# Patient Record
Sex: Male | Born: 1970 | Race: White | Hispanic: No | Marital: Married | Smoking: Never smoker
Health system: Southern US, Community
[De-identification: ages and names within clinical notes are randomized; demographics above are authoritative.]

## PROBLEM LIST (undated history)

## (undated) DIAGNOSIS — I10 Essential (primary) hypertension: Secondary | ICD-10-CM

## (undated) DIAGNOSIS — M7650 Patellar tendinitis, unspecified knee: Secondary | ICD-10-CM

## (undated) DIAGNOSIS — K219 Gastro-esophageal reflux disease without esophagitis: Secondary | ICD-10-CM

## (undated) DIAGNOSIS — J45909 Unspecified asthma, uncomplicated: Secondary | ICD-10-CM

## (undated) DIAGNOSIS — M533 Sacrococcygeal disorders, not elsewhere classified: Secondary | ICD-10-CM

## (undated) DIAGNOSIS — Z9889 Other specified postprocedural states: Secondary | ICD-10-CM

## (undated) DIAGNOSIS — M2020 Hallux rigidus, unspecified foot: Secondary | ICD-10-CM

## (undated) DIAGNOSIS — M7662 Achilles tendinitis, left leg: Secondary | ICD-10-CM

## (undated) HISTORY — DX: Hallux rigidus, unspecified foot: M20.20

## (undated) HISTORY — DX: Sacrococcygeal disorders, not elsewhere classified: M53.3

## (undated) HISTORY — DX: Achilles tendinitis, left leg: M76.62

## (undated) HISTORY — DX: Patellar tendinitis, unspecified knee: M76.50

## (undated) HISTORY — DX: Unspecified asthma, uncomplicated: J45.909

## (undated) HISTORY — PX: KNEE SURGERY: SHX244

## (undated) HISTORY — PX: NO PAST SURGERIES: SHX2092

## (undated) HISTORY — DX: Gastro-esophageal reflux disease without esophagitis: K21.9

## (undated) HISTORY — DX: Essential (primary) hypertension: I10

## (undated) HISTORY — DX: Other specified postprocedural states: Z98.890

---

## 1998-11-04 ENCOUNTER — Encounter: Admission: RE | Admit: 1998-11-04 | Discharge: 1998-11-04 | Payer: Self-pay | Admitting: Sports Medicine

## 1998-12-30 ENCOUNTER — Encounter: Admission: RE | Admit: 1998-12-30 | Discharge: 1998-12-30 | Payer: Self-pay | Admitting: Family Medicine

## 1999-03-03 ENCOUNTER — Encounter: Admission: RE | Admit: 1999-03-03 | Discharge: 1999-03-03 | Payer: Self-pay | Admitting: Sports Medicine

## 2000-01-12 ENCOUNTER — Encounter: Admission: RE | Admit: 2000-01-12 | Discharge: 2000-01-12 | Payer: Self-pay | Admitting: Sports Medicine

## 2000-10-25 ENCOUNTER — Encounter: Admission: RE | Admit: 2000-10-25 | Discharge: 2000-10-25 | Payer: Self-pay | Admitting: Sports Medicine

## 2001-05-20 ENCOUNTER — Encounter: Admission: RE | Admit: 2001-05-20 | Discharge: 2001-05-20 | Payer: Self-pay | Admitting: Sports Medicine

## 2001-12-19 ENCOUNTER — Encounter: Admission: RE | Admit: 2001-12-19 | Discharge: 2001-12-19 | Payer: Self-pay | Admitting: Sports Medicine

## 2002-01-02 ENCOUNTER — Encounter: Admission: RE | Admit: 2002-01-02 | Discharge: 2002-01-02 | Payer: Self-pay | Admitting: Sports Medicine

## 2002-10-02 ENCOUNTER — Encounter: Admission: RE | Admit: 2002-10-02 | Discharge: 2002-10-02 | Payer: Self-pay | Admitting: Sports Medicine

## 2004-01-21 ENCOUNTER — Encounter: Admission: RE | Admit: 2004-01-21 | Discharge: 2004-01-21 | Payer: Self-pay | Admitting: Sports Medicine

## 2005-01-05 ENCOUNTER — Ambulatory Visit: Payer: Self-pay | Admitting: Sports Medicine

## 2005-02-01 ENCOUNTER — Ambulatory Visit: Payer: Self-pay | Admitting: Sports Medicine

## 2005-09-03 ENCOUNTER — Ambulatory Visit: Payer: Self-pay | Admitting: Family Medicine

## 2005-12-18 ENCOUNTER — Ambulatory Visit: Payer: Self-pay | Admitting: Family Medicine

## 2006-08-16 ENCOUNTER — Encounter: Payer: Self-pay | Admitting: *Deleted

## 2006-10-04 ENCOUNTER — Ambulatory Visit: Payer: Self-pay | Admitting: Sports Medicine

## 2006-10-04 DIAGNOSIS — M79609 Pain in unspecified limb: Secondary | ICD-10-CM | POA: Insufficient documentation

## 2006-10-04 DIAGNOSIS — M25559 Pain in unspecified hip: Secondary | ICD-10-CM | POA: Insufficient documentation

## 2006-10-04 DIAGNOSIS — M533 Sacrococcygeal disorders, not elsewhere classified: Secondary | ICD-10-CM | POA: Insufficient documentation

## 2006-11-12 ENCOUNTER — Ambulatory Visit: Payer: Self-pay | Admitting: Sports Medicine

## 2006-11-12 DIAGNOSIS — M7650 Patellar tendinitis, unspecified knee: Secondary | ICD-10-CM | POA: Insufficient documentation

## 2009-01-31 ENCOUNTER — Ambulatory Visit: Payer: Self-pay | Admitting: Sports Medicine

## 2009-01-31 DIAGNOSIS — M2020 Hallux rigidus, unspecified foot: Secondary | ICD-10-CM | POA: Insufficient documentation

## 2009-01-31 DIAGNOSIS — Q667 Congenital pes cavus, unspecified foot: Secondary | ICD-10-CM | POA: Insufficient documentation

## 2009-08-09 ENCOUNTER — Ambulatory Visit: Payer: Self-pay | Admitting: Internal Medicine

## 2009-08-09 DIAGNOSIS — J45909 Unspecified asthma, uncomplicated: Secondary | ICD-10-CM

## 2009-08-10 ENCOUNTER — Ambulatory Visit: Payer: Self-pay | Admitting: Internal Medicine

## 2009-08-10 LAB — CONVERTED CEMR LAB
AST: 35 units/L (ref 0–37)
BUN: 18 mg/dL (ref 6–23)
Basophils Absolute: 0 10*3/uL (ref 0.0–0.1)
Bilirubin, Direct: 0.2 mg/dL (ref 0.0–0.3)
Calcium: 9.3 mg/dL (ref 8.4–10.5)
Cholesterol: 178 mg/dL (ref 0–200)
Creatinine, Ser: 0.8 mg/dL (ref 0.4–1.5)
Eosinophils Relative: 1.2 % (ref 0.0–5.0)
GFR calc non Af Amer: 113.21 mL/min (ref >60)
Glucose, Bld: 92 mg/dL (ref 70–99)
HCT: 46.1 % (ref 39.0–52.0)
HDL: 78.6 mg/dL (ref >39.00)
LDL Cholesterol: 94 mg/dL (ref 0–99)
Leukocytes, UA: NEGATIVE
Lymphs Abs: 1.7 10*3/uL (ref 0.7–4.0)
Monocytes Absolute: 0.4 10*3/uL (ref 0.1–1.0)
Monocytes Relative: 6.2 % (ref 3.0–12.0)
Neutrophils Relative %: 63.6 % (ref 43.0–77.0)
PSA: 0.66 ng/mL (ref 0.10–4.00)
Platelets: 138 10*3/uL — ABNORMAL LOW (ref 150.0–400.0)
Potassium: 4.1 meq/L (ref 3.5–5.1)
RDW: 12.5 % (ref 11.5–14.6)
Specific Gravity, Urine: <=1.005 (ref 1.000–1.030)
TSH: 1.31 microintl units/mL (ref 0.35–5.50)
Total Bilirubin: 1 mg/dL (ref 0.3–1.2)
Triglycerides: 29 mg/dL (ref 0.0–149.0)
Urobilinogen, UA: 0.2 (ref 0.0–1.0)
VLDL: 5.8 mg/dL (ref 0.0–40.0)
WBC: 6 10*3/uL (ref 4.5–10.5)
pH: 6 (ref 5.0–8.0)

## 2010-01-09 ENCOUNTER — Ambulatory Visit: Payer: Self-pay | Admitting: Internal Medicine

## 2010-01-09 DIAGNOSIS — J029 Acute pharyngitis, unspecified: Secondary | ICD-10-CM

## 2010-03-14 ENCOUNTER — Ambulatory Visit: Payer: Self-pay | Admitting: Sports Medicine

## 2010-06-06 ENCOUNTER — Ambulatory Visit
Admission: RE | Admit: 2010-06-06 | Discharge: 2010-06-06 | Payer: Self-pay | Source: Home / Self Care | Attending: Internal Medicine | Admitting: Internal Medicine

## 2010-06-06 DIAGNOSIS — R05 Cough: Secondary | ICD-10-CM

## 2010-06-06 DIAGNOSIS — R059 Cough, unspecified: Secondary | ICD-10-CM | POA: Insufficient documentation

## 2010-06-06 DIAGNOSIS — K219 Gastro-esophageal reflux disease without esophagitis: Secondary | ICD-10-CM | POA: Insufficient documentation

## 2010-06-16 ENCOUNTER — Telehealth: Payer: Self-pay | Admitting: Internal Medicine

## 2010-07-11 NOTE — Assessment & Plan Note (Signed)
Summary: sore throat-lb   Vital Signs:  Patient profile:   40 year old male Height:      68 inches (172.72 cm) Weight:      161 pounds (73.18 kg) BMI:     24.57 O2 Sat:      97 % on Room air Temp:     98.3 degrees F (36.83 degrees C) oral Pulse rate:   46 / minute BP sitting:   124 / 80  (left arm) Cuff size:   regular  Vitals Entered By: Orlan Leavens RMA (January 09, 2010 11:23 AM)  O2 Flow:  Room air CC: Sore throat, URI symptoms Is Patient Diabetic? No Pain Assessment Patient in pain? no      Comments Pt states been having sore throat off and on for about 2 months   Primary Care Provider:  Newt Lukes MD  CC:  Sore throat and URI symptoms.  History of Present Illness:  URI Symptoms      This is a 40 year old man who presents with URI symptoms.  The symptoms began 2 months ago.  The severity is described as moderate.  wax and wane symptoms of pain in throat, esp with singining and esp 1st AM waking -.  The patient reports sore throat and sick contacts, but denies nasal congestion, purulent nasal discharge, dry cough, productive cough, and earache.  The patient denies fever, stiff neck, dyspnea, wheezing, rash, vomiting, diarrhea, and use of an antipyretic.  The patient also reports itchy throat.  The patient denies sneezing, seasonal symptoms, response to antihistamine, headache, muscle aches, and severe fatigue.  Risk factors for Strep sinusitis include Strep exposure.  The patient denies the following risk factors for Strep sinusitis: unilateral facial pain, unilateral nasal discharge, double sickening, tooth pain, and tender adenopathy.    Current Medications (verified): 1)  None  Allergies (verified): No Known Drug Allergies  Past History:  Past Medical History: Asthma  Review of Systems       The patient complains of hoarseness and severe indigestion/heartburn.  The patient denies anorexia, decreased hearing, hemoptysis, and abdominal pain.    Physical  Exam  General:  alert, well-developed, well-nourished, and cooperative to examination.   nontoxic Eyes:  vision grossly intact; pupils equal, round and reactive to light.  conjunctiva and lids normal.    Ears:  normal pinnae bilaterally, without erythema, swelling, or tenderness to palpation. TMs clear with tiny clear effusion/air bubbles, no erythema or cerumen impaction. Hearing grossly normal bilaterally  Mouth:  teeth and gums in good repair; mucous membranes moist, without lesions or ulcers. oropharynx clear without exudate, mod erythema. +PND Lungs:  normal respiratory effort, no intercostal retractions or use of accessory muscles; normal breath sounds bilaterally - no crackles and no wheezes.    Heart:  normal rate, regular rhythm, no murmur, and no rub. BLE without edema.    Impression & Recommendations:  Problem # 1:  ACUTE PHARYNGITIS (ICD-462)  tx for poss infx (wife with strep) as well as antihist for allg rhinitis and PPI for GERD symptoms  instructed on voice rest and to avoid running in extreme heat of day nonsmoker - no weight loss and voice loss in transient each AM upon waking - if persisting symptoms - pt will let us know for further eval and tx as needed  His updated medication list for this problem includes:    Azithromycin 250 Mg Tabs (Azithromycin) .Marland Kitchen... 2 tabs by mouth today, then 1 by mouth  daily starting tomorrow  Instructed to complete antibiotics and call if not improved in 48 hours.   Orders: Prescription Created Electronically [phone]  Complete Medication List: 1)  Azithromycin 250 Mg Tabs (Azithromycin) .... 2 tabs by mouth today, then 1 by mouth daily starting tomorrow 2)  Claritin 10 Mg Tabs (Loratadine) .Marland Kitchen.. 1 by mouth once daily x 7 days,then as needed for allergy symptoms 3)  Omeprazole 20 Mg Cpdr (Omeprazole) .Marland Kitchen.. 1 by mouth once daily x 7 days, then as needed for reflux  Patient Instructions: 1)  it was good to see you today. 2)  treat throat as  discussed - Zpack, claritin and reflux medication (like ompeprazole) - antibiotic prescription has been electronically submitted to your pharmacy. Please take as directed. Contact our office if you believe you're having problems with the medication(s).  3)  Get plenty of rest, drink lots of clear liquids, and use Tylenol or Ibuprofen for fever and comfort. Return in 7-10 days if you're not better:sooner if you're feeling worse. Prescriptions: AZITHROMYCIN 250 MG TABS (AZITHROMYCIN) 2 tabs by mouth today, then 1 by mouth daily starting tomorrow  #6 x 0   Entered and Authorized by:   Newt Lukes MD   Signed by:   Newt Lukes MD on 01/09/2010   Method used:   Electronically to        [facility] [address] [phone]* (retail)       [address]       [address]       [place], [place]  [address]       Ph: [phone]       Fax: [phone]   RxID:   [id number]

## 2010-07-11 NOTE — Assessment & Plan Note (Signed)
Summary: new / bcbs / # / cd   Vital Signs:  Patient profile:   40 year old male Height:      68 inches (172.72 cm) Weight:      161.4 pounds (73.36 kg) O2 Sat:      97 % on Room air Temp:     97.4 degrees F (36.33 degrees C) oral Pulse rate:   55 / minute BP sitting:   110 / 80  (left arm)  Vitals Entered By: Orlan Leavens (August 09, 2009 3:17 PM)  O2 Flow:  Room air CC: New patient Is Patient Diabetic? No Pain Assessment Patient in pain? no        Primary Care Provider:  Newt Lukes MD  CC:  New patient.  History of Present Illness: new pt to me and our practice - here to est care - patient is here today for annual physical. Patient feels well and has no complaints.  not fasting but will return in AM for labs  Preventive Screening-Counseling & Management  Alcohol-Tobacco     Alcohol drinks/day: <1     Alcohol Counseling: not indicated; use of alcohol is not excessive or problematic     Smoking Status: never     Tobacco Counseling: not indicated; no tobacco use  Caffeine-Diet-Exercise     Does Patient Exercise: yes     Exercise Counseling: not indicated; exercise is adequate     Depression Counseling: not indicated; screening negative for depression  Safety-Violence-Falls     Seat Belt Use: yes  Current Medications (verified): 1)  None  Allergies (verified): No Known Drug Allergies  Past History:  Past Medical History: Asthma  Past Surgical History: Denies surgical history  Family History: Family History of Alcoholism/Addiction (parent,grandparent) Family History Hypertension (parent)  Social History: Never Smoked social alcohol works Research officer, political party - married, lives with wife and 2 kids -Smoking Status:  never Does Patient Exercise:  yes Risk analyst Use:  yes  Review of Systems       see HPI above. I have reviewed all other systems and they were negative.   Physical Exam  General:  alert, well-developed, well-nourished, and cooperative  to examination.    Head:  Normocephalic and atraumatic without obvious abnormalities. No apparent alopecia or balding. Eyes:  vision grossly intact; pupils equal, round and reactive to light.  conjunctiva and lids normal.    Ears:  normal pinnae bilaterally, without erythema, swelling, or tenderness to palpation. TMs clear, without effusion, or cerumen impaction. Hearing grossly normal bilaterally  Nose:  External nasal examination shows no deformity or inflammation. Nasal mucosa are pink and moist without lesions or exudates. Mouth:  teeth and gums in good repair; mucous membranes moist, without lesions or ulcers. oropharynx clear without exudate, no erythema.  Neck:  supple, full ROM, no masses, no thyromegaly; no thyroid nodules or tenderness. no JVD or carotid bruits.   Lungs:  normal respiratory effort, no intercostal retractions or use of accessory muscles; normal breath sounds bilaterally - no crackles and no wheezes.    Heart:  normal rate, regular rhythm, no murmur, and no rub. BLE without edema.  Abdomen:  soft, non-tender, normal bowel sounds, no distention; no masses and no appreciable hepatomegaly or splenomegaly.   Msk:  No deformity or scoliosis noted of thoracic or lumbar spine.   Neurologic:  alert & oriented X3 and cranial nerves II-XII symetrically intact.  strength normal in all extremities, sensation intact to light touch, and gait normal.  speech fluent without dysarthria or aphasia; follows commands with good comprehension.  Skin:  no rashes, vesicles, ulcers, or erythema. No nodules or irregularity to palpation.  Psych:  Oriented X3, memory intact for recent and remote, normally interactive, good eye contact, not anxious appearing, not depressed appearing, and not agitated.      Impression & Recommendations:  Problem # 1:  PREVENTIVE HEALTH CARE (ICD-V70.0)  Patient has been counseled on age-appropriate routine health concerns for screening and prevention. These are  reviewed and up-to-date. Immunizations are up-to-date or declined. Labs ordered and will be reviewed; ECG reviewed.   Orders: EKG w/ Interpretation (93000)  Other Orders: Tdap => 49yrs IM ([address]109) Admin 1st Vaccine (60454)  Patient Instructions: 1)  it was good to see you today.  2)  return for physical labs in AM when you are fasting (nothing eat/drink for 8 hours) - your results will be posted on the phone tree for review in 48-72 hours from the time of test completion; call [phone] and enter your 9 digit MRN (listed above on this page, just below your name); if any changes need to be made or there are abnormal results, you will be contacted directly.  3)  EKG and exam today look good - 4)  Tdap vaccine updated today 5)  keep active, maintain your healthy weight 6)  Please schedule a follow-up appointment annually for medical physical and labs, sooner if any problems.    Immunization History:  Pneumovax Immunization History:    Pneumovax:  historical (06/12/2007)  Immunizations Administered:  Tetanus Vaccine:    Vaccine Type: Tdap    Site: right deltoid    Mfr: GlaxoSmithKline    Dose: 0.5 ml    Route: IM    Given by: Orlan Leavens    Exp. Date: 08/06/2011    Lot #: [id number]    VIS given: 08/09/09

## 2010-07-11 NOTE — Assessment & Plan Note (Signed)
Summary: FOOT ISSUES,MC   Vital Signs:  Patient profile:   40 year old male Pulse rate:   43 / minute BP sitting:   152 / 89  (left arm)  Vitals Entered By: Rochele Pages RN (March 14, 2010 9:00 AM) CC: rt great toe pain with tennis, rt mid foot pain w/ running   Primary Provider:  Newt Lukes MD  CC:  rt great toe pain with tennis and rt mid foot pain w/ running.  History of Present Illness: Patient retuns to clinic today for evaluation of rt great toe pain and rt mid foot pain.   Patient is a runner, does about 25 miles per week.  He also plays tennis 3 times per week. Rt great toe pain occurs after playing tennis for about 1 month. Rt mid foot pain occurs while running for the last 3-4 months.   Original injury to great toe in college    Allergies: No Known Drug Allergies  Physical Exam  General:  Well-developed,well-nourished,in no acute distress; alert,appropriate and cooperative throughout examination Msk:  moderate loss of arch long bilaterally this corrects w orthotics  RT great toe shows marked spurring and change motion is limited but still has 20 deg flex and 20 of extension LT great toe w good motion  MSK  [place] Great to shows spurs superiorly and laterally on dynamic motion one spurs impinges from phalanx side to his MC side of joint with even 10 deg of dorsiflexion inc fluid noted also inc doppler activity       Impression & Recommendations:  Problem # 1:  HALLUX RIGIDUS, 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 1 by mouth once daily x 7 days,then as needed for allergy symptoms 2)  Omeprazole 20 Mg Cpdr (Omeprazole) .Marland Kitchen.. 1 by mouth  once daily x 7 days, then as needed for reflux 3)  Ketoprofen Gel 20%  .... Use qid Prescriptions: KETOPROFEN GEL 20% use qid  #60 gms x 12   Entered and Authorized by:   Enid Baas MD   Signed by:   Enid Baas MD on 03/14/2010   Method used:   Print then Give to Patient   RxID:   [id number]

## 2010-07-13 NOTE — Assessment & Plan Note (Signed)
Summary: cough for a few days getting worse/cd   Vital Signs:  Patient profile:   40 year old male Height:      68 inches (172.72 cm) Weight:      161 pounds (73.18 kg) O2 Sat:      97 % on Room air Temp:     99.2 degrees F (37.33 degrees C) oral Pulse rate:   60 / minute BP sitting:   138 / 80  (left arm) Cuff size:   regular  Vitals Entered By: Orlan Leavens RMA (June 06, 2010 10:44 AM)  O2 Flow:  Room air CC: Cough Is Patient Diabetic? No Pain Assessment Patient in pain? no      Comments Pt states was given rx for Z-pack & prednisone last week. completed Zpack yesterday, and prednisone today. Still have cough with phlegm. Yesterday pt states cough up bright red phlegm. Coughing alot a night. Also c/o of ongoing heart burn has not been taking omeprazole   Primary Care Provider:  Newt Lukes MD  CC:  Cough.  History of Present Illness: c/o cough -  recent bronchitis symptoms - s/p Zpak and pred pak - improved but not resolved - bright red blood with sputum/cough each AM x 2 AM -  no CP, no fever, no DOE or SOB - denies NS, no travel hx - no LE swelling/edema inc reflux/heartburn symptoms with cough - coughing all night GERD not improved with zantac not using other otc meds  Current Medications (verified): 1)  Omeprazole 20 Mg Cpdr (Omeprazole) .Marland Kitchen.. 1 By Mouth Once Daily X 7 Days, Then As Needed For Reflux 2)  Ketoprofen Gel 20% .... Use Qid 3)  Proventil Hfa 108 (90 Base) Mcg/act Aers (Albuterol Sulfate) .... Use As Needed 4)  Symbicort 80-4.5 Mcg/act Aero (Budesonide-Formoterol Fumarate) .... Use As Needed  Allergies (verified): No Known Drug Allergies  Past History:  Past Medical History: Asthma GERD  Review of Systems  The patient denies anorexia, weight loss, and peripheral edema.    Physical Exam  General:  alert, well-developed, well-nourished, and cooperative to examination.   nontoxic Eyes:  vision grossly intact; pupils equal, round and  reactive to light.  conjunctiva and lids normal.    Ears:  normal pinnae bilaterally, without erythema, swelling, or tenderness to palpation. TMs clear, without effusion, or cerumen impaction. Hearing grossly normal bilaterally  Mouth:  teeth and gums in good repair; mucous membranes moist, without lesions or ulcers. oropharynx clear without exudate, no erythema.  Neck:  supple, full ROM, no masses, no thyromegaly; no thyroid nodules or tenderness. no JVD or carotid bruits.   Lungs:  normal respiratory effort, no intercostal retractions or use of accessory muscles; normal breath sounds bilaterally - no crackles and no wheezes.    Heart:  normal rate, regular rhythm, no murmur, and no rub. BLE without edema.   Impression & Recommendations:  Problem # 1:  COUGH (ICD-786.2) recent bronchitis - infx seems resolved s/p Zpak tx cough symptoms - tussionex faxed to pharm - offered cxr due to hemopytsis - pt declines but will call if persisitng symptoms - no weight loss, nonsmoker - suspect bronchial irritation tx GERD with PPI and cough control advised to use asthma meds regualrly as rx'd during season change and acute illness - refills done  Problem # 2:  ASTHMA (ICD-493.90)  His updated medication list for this problem includes:    Proventil Hfa 108 (90 Base) Mcg/act Aers (Albuterol sulfate) .Marland Kitchen... 2 inhalations every 4  hours as needed for asthma symptoms and shortness of breath    Symbicort 80-4.5 Mcg/act Aero (Budesonide-formoterol fumarate) .Marland Kitchen... 2 puffs two times a day  Pulmonary Functions Reviewed: O2 sat: 97 (06/06/2010)  Orders: Prescription Created Electronically [phone])  Problem # 3:  GERD (ICD-530.81)  His updated medication list for this problem includes:    Omeprazole 20 Mg Tbec (Omeprazole) .Marland Kitchen... 1 by mouth two times a day x 7 days, then once daily for reflux symptoms  Labs Reviewed: Hgb: 15.7 (08/10/2009)   Hct: 46.1 (08/10/2009)  Orders: Prescription Created  Electronically [phone])  Complete Medication List: 1)  Omeprazole 20 Mg Tbec (Omeprazole) .Marland Kitchen.. 1 by mouth two times a day x 7 days, then once daily for reflux symptoms 2)  Ketoprofen Gel 20%  .... Use qid 3)  Proventil Hfa 108 (90 Base) Mcg/act Aers (Albuterol sulfate) .... 2 inhalations every 4 hours as needed for asthma symptoms and shortness of breath 4)  Symbicort 80-4.5 Mcg/act Aero (Budesonide-formoterol fumarate) .... 2 puffs two times a day 5)  Tussionex Pennkinetic Er 10-8 Mg/69ml Lqcr (Hydrocod polst-chlorphen polst) .... 5cc by mouth every 12h for cough symptoms  Patient Instructions: 1)  it was good to see you today. 2)  Tussionex for cough symptoms andomeprazole for reflux/heartburn - your prescriptions have been electronically submitted or faxed to your pharmacy. Please take as directed. Contact our office if you believe you're having problems with the medication(s).  3)  take asthma medications regularly, espically during fall/winter season when symptoms are worse 4)  Get plenty of rest, drink lots of clear liquids, and use Tylenol or Ibuprofen for fever and comfort. Return in 7-10 days if you're not better:sooner if you're feeling worse. 5)  If continued cough or blood in sputum, call for chest xray as we discussed today Prescriptions: TUSSIONEX PENNKINETIC ER 10-8 MG/5ML LQCR (HYDROCOD POLST-CHLORPHEN POLST) 5cc by mouth every 12h for cough symptoms  #100cc x 0   Entered and Authorized by:   Newt Lukes MD   Signed by:   Newt Lukes MD on 06/06/2010   Method used:   Printed then faxed to ...       [facility] [address] [phone]* (retail)       [address]       [address]       [place], [place]  [address]       Ph: [phone]       Fax: [phone]   RxID:   [id number] SYMBICORT 80-4.5 MCG/ACT AERO (BUDESONIDE-FORMOTEROL FUMARATE) 2 puffs two times a day  #1 x 3   Entered and Authorized by:   Newt Lukes MD   Signed by:   Newt Lukes MD  on 06/06/2010   Method used:   Electronically to        [facility] [address] [phone]* (retail)       [address]       [address]       [place], [place]  [address]       Ph: [phone]       Fax: [phone]   RxID:   [id number] PROVENTIL HFA 108 (90 BASE) MCG/ACT AERS (ALBUTEROL SULFATE) 2 inhalations every 4 hours as needed for asthma symptoms and shortness of breath  #1 x 1   Entered and Authorized by:   Newt Lukes MD   Signed by:   Newt Lukes MD on 06/06/2010   Method used:   Electronically to  [facility] [address] [phone]* (retail)       [address]       [address]       [place], [place]  [address]       Ph: [phone]       Fax: [phone]   RxID:   [id number] OMEPRAZOLE 20 MG TBEC (OMEPRAZOLE) 1 by mouth two times a day x 7 days, then once daily for reflux symptoms  #30 x 1   Entered and Authorized by:   Newt Lukes MD   Signed by:   Newt Lukes MD on 06/06/2010   Method used:   Electronically to        [facility] [address] [phone]* (retail)       [address]       [address]       [place], [place]  [address]       Ph: [phone]       Fax: [phone]   RxID:   [id number]    Orders Added: 1)  Est. Patient Level IV [32[address]] 2)  Prescription Created Electronically [phone]

## 2010-07-13 NOTE — Progress Notes (Signed)
Summary: ABX refill req  Phone Note Call from Patient   Caller: Patient [phone] Summary of Call: Pt called stating that he has almost completed med courses given at last OV for cough and although cough did go away in the last 2 days cough has return. Pt does not want cough to get worse s he is almost done with ABX and is therefore requesting refill of ABX, please advise. Initial call taken by: Margaret Pyle, CMA,  June 16, 2010 11:20 AM  Follow-up for Phone Call        Zpak erx done Follow-up by: Newt Lukes MD,  June 16, 2010 11:48 AM  Additional Follow-up for Phone Call Additional follow up Details #1::        Pt informed via VM Additional Follow-up by: Margaret Pyle, CMA,  June 16, 2010 12:42 PM    New/Updated Medications: AZITHROMYCIN 250 MG TABS (AZITHROMYCIN) 2 tabs by mouth today, then 1 by mouth daily starting tomorrow Prescriptions: AZITHROMYCIN 250 MG TABS (AZITHROMYCIN) 2 tabs by mouth today, then 1 by mouth daily starting tomorrow  #6 x 0   Entered and Authorized by:   Newt Lukes MD   Signed by:   Newt Lukes MD on 06/16/2010   Method used:   Electronically to        [facility] [address] [phone]* (retail)       [address]       [address]       [place], [place]  [address]       Ph: [phone]       Fax: [phone]   RxID:   [id number]

## 2010-12-28 ENCOUNTER — Ambulatory Visit (INDEPENDENT_AMBULATORY_CARE_PROVIDER_SITE_OTHER): Payer: BC Managed Care – PPO | Admitting: Sports Medicine

## 2010-12-28 ENCOUNTER — Encounter: Payer: Self-pay | Admitting: Sports Medicine

## 2010-12-28 VITALS — BP 155/92 | HR 46 | Ht 68.0 in | Wt 160.0 lb

## 2010-12-28 DIAGNOSIS — M7660 Achilles tendinitis, unspecified leg: Secondary | ICD-10-CM

## 2010-12-28 DIAGNOSIS — M7650 Patellar tendinitis, unspecified knee: Secondary | ICD-10-CM

## 2010-12-28 DIAGNOSIS — M722 Plantar fascial fibromatosis: Secondary | ICD-10-CM

## 2010-12-28 DIAGNOSIS — M7662 Achilles tendinitis, left leg: Secondary | ICD-10-CM

## 2010-12-28 NOTE — Progress Notes (Signed)
  Subjective:    Patient ID: Cameron White, male    DOB: Oct 11, 1970, 40 y.o.   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 - running pain is primarily knee and sometimes AT going up hill Cycling 1 day 30 miles - no pain Tennis 3 days week - no pain   Review of Systems     Objective:   Physical Exam NAD    Good ankle mobility, normal exam bilat Rt- Hallux rigidus MTP change  Rt great toe - Extension and flexion 15 deg No pain on palpation of tarsal tunnel or PF on rt  Lt great toe-20 deg extension, and 15 deg flexion Lt AT tender to squeeze but no swelling detected No pain on palpation of tarsal tunnel or PF on lt  Rt knee exam:  normal exam except bony hypertrophy at the end of patella    MSK ultrasound Right knee shows an exostosis calcific change and some tendon hypoechoic change at the distal patellar pole and proximal patellar tendon; remainder of tenderness normal Right Achilles tendon and right and left plantar fascia are all normal to scanning Left Achilles tendon reveals that the AP with just proximal to the calcaneus is 0.47. At 4 cm above the calcaneus he has a nodule that measures 0.85 cm. A longitudinal transverse views there or hypoechoic changes within the nodule. There is normal Doppler activity. There is some slight hypoechoic change and probable fluid in the tendon sheath. Assessment & Plan:

## 2010-12-28 NOTE — Assessment & Plan Note (Signed)
We will begin him on standard exercises for Achilles rehabilitation He lives were added to the orthotics for his shoes I think he should wear these for tinnitus as well as running I want him to start using arch support to the arch pain in his regular shoes He is to use a quarter of a nitroglycerin patch over the nodule Avoid hills and speed work  Engineer, maintenance (IT) in about 6 weeks

## 2010-12-28 NOTE — Assessment & Plan Note (Signed)
History of prior patellar tendinitis and with a new flare of symptoms. Now shows an exostosis with calcific change and some partial separation of the tendon is probable. We'll plan to use a nitroglycerin protocol over the proximal right patellar tendon. Begin eccentric quadriceps exercises Repeat scan and evaluation in 6 weeks

## 2011-01-10 ENCOUNTER — Encounter: Payer: Self-pay | Admitting: Internal Medicine

## 2011-01-11 ENCOUNTER — Other Ambulatory Visit (INDEPENDENT_AMBULATORY_CARE_PROVIDER_SITE_OTHER): Payer: BC Managed Care – PPO

## 2011-01-11 ENCOUNTER — Ambulatory Visit (INDEPENDENT_AMBULATORY_CARE_PROVIDER_SITE_OTHER): Payer: BC Managed Care – PPO | Admitting: Internal Medicine

## 2011-01-11 ENCOUNTER — Encounter: Payer: Self-pay | Admitting: Internal Medicine

## 2011-01-11 VITALS — BP 142/90 | HR 44 | Temp 98.1°F | Ht 68.0 in | Wt 161.8 lb

## 2011-01-11 DIAGNOSIS — I10 Essential (primary) hypertension: Secondary | ICD-10-CM

## 2011-01-11 DIAGNOSIS — Z0000 Encounter for general adult medical examination without abnormal findings: Secondary | ICD-10-CM

## 2011-01-11 LAB — CBC WITH DIFFERENTIAL/PLATELET
Basophils Relative: 0.6 % (ref 0.0–3.0)
Eosinophils Relative: 1.3 % (ref 0.0–5.0)
HCT: 45.9 % (ref 39.0–52.0)
Hemoglobin: 15.9 g/dL (ref 13.0–17.0)
Lymphs Abs: 1.9 10*3/uL (ref 0.7–4.0)
MCV: 100.4 fl — ABNORMAL HIGH (ref 78.0–100.0)
Monocytes Absolute: 0.4 10*3/uL (ref 0.1–1.0)
Monocytes Relative: 6.1 % (ref 3.0–12.0)
RBC: 4.57 Mil/uL (ref 4.22–5.81)
WBC: 6.4 10*3/uL (ref 4.5–10.5)

## 2011-01-11 LAB — LIPID PANEL
Cholesterol: 195 mg/dL (ref 0–200)
HDL: 78.8 mg/dL (ref >39.00)
LDL Cholesterol: 111 mg/dL — ABNORMAL HIGH (ref 0–99)
VLDL: 5.4 mg/dL (ref 0.0–40.0)

## 2011-01-11 LAB — URINALYSIS
Specific Gravity, Urine: <=1.005 (ref 1.000–1.030)
Total Protein, Urine: NEGATIVE
Urine Glucose: NEGATIVE
Urobilinogen, UA: 0.2 (ref 0.0–1.0)
pH: 6 (ref 5.0–8.0)

## 2011-01-11 LAB — HEPATIC FUNCTION PANEL
ALT: 29 U/L (ref 0–53)
Albumin: 4.5 g/dL (ref 3.5–5.2)
Bilirubin, Direct: 0.2 mg/dL (ref 0.0–0.3)
Total Protein: 7.1 g/dL (ref 6.0–8.3)

## 2011-01-11 LAB — BASIC METABOLIC PANEL
BUN: 18 mg/dL (ref 6–23)
Chloride: 103 mEq/L (ref 96–112)
GFR: 137.97 mL/min (ref >60.00)
Potassium: 4.4 mEq/L (ref 3.5–5.1)
Sodium: 138 mEq/L (ref 135–145)

## 2011-01-11 MED ORDER — LOSARTAN POTASSIUM 25 MG PO TABS: 25.0000 mg | ORAL_TABLET | Freq: Every day | ORAL | Status: DC

## 2011-01-11 NOTE — Progress Notes (Signed)
Subjective:    Patient ID: Cameron White, male    DOB: 05-05-1971, 40 y.o.   MRN: [id number]  HPI  patient is here today for annual physical. Patient feels well and has no complaints.  ?re: elev BP last few weeks - increased stress at work but FH HTN noted  Past Medical History  Diagnosis Date  . ASTHMA   . GERD   . Achilles tendinitis on left   . Patellar tendinitis   . HALLUX RIGIDUS, ACQUIRED   . SACROILIAC JOINT DYSFUNCTION    Family History  Problem Relation Age of Onset  . Alcohol abuse Other     parent & grandparent  . Hypertension Other   . Hypertension Father   . Heart disease Paternal Grandfather   . Hypertension Paternal Grandfather    History  Substance Use Topics  . Smoking status: Never Smoker   . 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 Alcohol Use: Yes     social   Review of Systems Constitutional: Negative for fever.  Respiratory: Negative for cough and shortness of breath.   Cardiovascular: Negative for chest pain.  Gastrointestinal: Negative for abdominal pain.  Musculoskeletal: Negative for gait problem.  Skin: Negative for rash.  Neurological: Negative for dizziness.  No other specific complaints in a complete review of systems (except as listed in HPI above).     Objective:   Physical Exam BP 142/90  Pulse 44  Temp(Src) 98.1 F (36.7 C) (Oral)  Ht 5\' 8"  (1.727 m)  Wt 161 lb 12.8 oz (73.392 kg)  BMI 24.60 kg/m2  SpO2 98% Wt Readings from Last 3 Encounters:  01/11/11 161 lb 12.8 oz (73.392 kg)  12/28/10 160 lb (72.576 kg)  06/06/10 161 lb (73.029 kg)   BP Readings from Last 3 Encounters:  01/11/11 142/90  12/28/10 155/92  06/06/10 138/80   Constitutional:  oriented to person, place, and time. appears well-developed and well-nourished. No distress.  Neck: Normal range of motion. Neck supple. No JVD present. No thyromegaly present.  Cardiovascular: Normal rate, regular  rhythm and normal heart sounds.  No murmur heard. no BLE edema Pulmonary/Chest: Effort normal and breath sounds normal. No respiratory distress. no wheezes.  Abdominal: Soft. Bowel sounds are normal. Patient exhibits no distension. There is no tenderness.  Musculoskeletal: Normal range of motion. No gross deformities GU: deferred Neurological: he is alert and oriented to person, place, and time. No cranial nerve deficit. Coordination normal.  Skin: Skin is warm and dry.  No erythema or ulceration.  Psychiatric: he has a normal mood and affect. behavior is normal. Judgment and thought content normal.    Lab Results  Component Value Date   WBC 6.0 08/10/2009   HGB 15.7 08/10/2009   HCT 46.1 08/10/2009   PLT 138.0* 08/10/2009   CHOL 178 08/10/2009   TRIG 29.0 08/10/2009   HDL 78.60 08/10/2009   ALT 38 08/10/2009   AST 35 08/10/2009   NA 138 08/10/2009   K 4.1 08/10/2009   CL 104 08/10/2009   CREATININE 0.8 08/10/2009   BUN 18 08/10/2009   CO2 27 08/10/2009   TSH 1.31 08/10/2009   PSA 0.66 08/10/2009    EKG - marked sinus brady @46bpm , unchanged abn since 08/09/09 with ST elev v1-v3     Assessment & Plan:  CPX - v70.0 - Patient has been counseled on age-appropriate routine health concerns for screening and prevention. These are reviewed  and up-to-date. Immunizations are up-to-date or declined. Labs ordered and ECG reviewed.  New dx HTN - reviewed elevated BP at prior OV and recent OP gym readings, FH same - already at ideal BMI, exercises aggressively - suggest sodium reduction but also start low dose ARB - erx done - recheck, titrate 3-4 weeks, risk/benefit reviewed and pt agrees to same - will call if problems

## 2011-01-11 NOTE — Patient Instructions (Signed)
It was good to see you today. Exam and EKG look good Test(s) ordered today. Your results will be called to you after review (48-72hours after test completion). If any changes need to be made, you will be notified at that time. Will start losartan for high blood pressure - Your prescription(s) have been submitted to your pharmacy. Please take as directed and contact our office if you believe you are having problem(s) with the medication(s). Please schedule followup in 3-4 weeks to recheck blood pressure and titrate medications as needed; call sooner if problems.

## 2011-03-08 ENCOUNTER — Ambulatory Visit (INDEPENDENT_AMBULATORY_CARE_PROVIDER_SITE_OTHER): Payer: BC Managed Care – PPO | Admitting: Family Medicine

## 2011-03-08 ENCOUNTER — Encounter: Payer: Self-pay | Admitting: Family Medicine

## 2011-03-08 VITALS — BP 143/93 | HR 54 | Ht 68.0 in | Wt 155.0 lb

## 2011-03-08 DIAGNOSIS — M7580 Other shoulder lesions, unspecified shoulder: Secondary | ICD-10-CM

## 2011-03-08 DIAGNOSIS — M719 Bursopathy, unspecified: Secondary | ICD-10-CM

## 2011-03-08 MED ORDER — NAPROXEN 500 MG PO TABS: ORAL_TABLET | ORAL | Status: DC

## 2011-03-08 NOTE — Progress Notes (Signed)
  Subjective:    Patient ID: Cameron White, male    DOB: [birth year], 40 y.o.   MRN: [id number]  HPI  Cameron White is complaining of right shoulder pain for the last 2 days, after he played tennis on Monday and Tuesday. He never had problems with his shoulder in the past. He states that his pain in 3/10, anterolateral, dull, worse with overhead activities and reaching to his back ,better with rest,  no numbness or tingling. No injuries.   Patient Active Problem List  Diagnoses  . ASTHMA  . HIP PAIN  . SACROILIAC JOINT DYSFUNCTION  . PATELLAR TENDINITIS  . CALF PAIN  . HALLUX RIGIDUS, ACQUIRED  . TALIPES CAVUS  . GERD  . Tendonitis, patellar  . Achilles tendinitis on left   Current Outpatient Prescriptions on File Prior to Visit  Medication Sig Dispense Refill  . albuterol (PROVENTIL HFA) 108 (90 BASE) MCG/ACT inhaler Inhale 2 puffs into the lungs every 4 (four) hours as needed.        . budesonide-formoterol (SYMBICORT) 80-4.5 MCG/ACT inhaler Inhale 2 puffs into the lungs 2 (two) times daily.        Marland Kitchen losartan (COZAAR) 25 MG tablet Take 1 tablet (25 mg total) by mouth daily.  30 tablet  11  . nitroGLYCERIN (NITRODUR - DOSED IN MG/24 HR) 0.2 mg/hr Place onto the skin daily. You will use 1/4 patch daily to patellar tendon RT and to Achilles tendon left Replace daily      . omeprazole (PRILOSEC) 20 MG capsule Take 20 mg by mouth daily.         No Known Allergies     Review of Systems  Constitutional: Negative for chills, diaphoresis and fatigue.  HENT: Negative for neck pain and neck stiffness.   Musculoskeletal: Negative for back pain and joint swelling.  Neurological: Negative for weakness and numbness.       Objective:   Physical Exam  Constitutional: He appears well-developed and well-nourished.       BP 143/93  Pulse 54  Ht 5\' 8"  (1.727 m)  Wt 155 lb (70.308 kg)  BMI 23.57 kg/m2   Neck: Normal range of motion. Neck supple.  Pulmonary/Chest: Effort normal.    Musculoskeletal:       Right shoulder with intact skin. No swelling no hematomas. Full range of motion for flexion extension internal, external rotation abduction and adduction. Hawkins test positive for tear cuff impingement . No A.c. joint tenderness to palpation with positive crossover test . Empty can test negative for rotator cuff tear. Speed test negative for biceps tendinoplasty No tenderness at the bicipital groove. Strength 5 over 5 with internal and external rotation . Sensation is intact .  Neurological: He is alert.  Skin: Skin is warm. No rash noted. No erythema. No pallor.  Psychiatric: He has a normal mood and affect. His behavior is normal.      MSK U/S :  L elbow: No hypoechoic areas in the extensor tendon, no increase in blood flow. No tendon defect.  L shoulder: No hypoechoic ares in the supraspinatus, subscapular, or infraspinatus. No fluid around biceps. Negative U/S        Assessment & Plan:   1. Rotator cuff tendinitis  naproxen (NAPROSYN) 500 MG tablet   RTC  strengthening exercise  Ice/moist heat 20 min bid F/U prn

## 2011-03-20 ENCOUNTER — Encounter: Payer: Self-pay | Admitting: Internal Medicine

## 2011-03-20 ENCOUNTER — Ambulatory Visit (INDEPENDENT_AMBULATORY_CARE_PROVIDER_SITE_OTHER): Payer: BC Managed Care – PPO | Admitting: Internal Medicine

## 2011-03-20 DIAGNOSIS — I10 Essential (primary) hypertension: Secondary | ICD-10-CM | POA: Insufficient documentation

## 2011-03-20 DIAGNOSIS — J019 Acute sinusitis, unspecified: Secondary | ICD-10-CM

## 2011-03-20 MED ORDER — AZITHROMYCIN 250 MG PO TABS: ORAL_TABLET | ORAL | Status: AC

## 2011-03-20 NOTE — Patient Instructions (Signed)
It was good to see you today. blood pressure looks good today but monitor BP Before your workout and call if consistently >130/85 for dose increase Zpack antibiotics for sinus infection symptoms  Your prescription(s) have been submitted to your pharmacy. Please take as directed and contact our office if you believe you are having problem(s) with the medication(s). Continue the symptomatic treatment with Claritin and Afrin nasal spray as needed Please schedule followup in 4-6 months for blood pressure, call sooner if problems.

## 2011-03-20 NOTE — Progress Notes (Signed)
  Subjective:    Patient ID: Cameron White, male    DOB: September 28, 1970, 40 y.o.   MRN: [id number]  HPI   patient is here today for follow up   HTN: Started ARB 01/2011 for same - the patient reports compliance with medication(s) as prescribed. Denies adverse side effects. At gym, SBP still 130-140 (after workout)  Also complains of productive cough and sinus pressure - onset 2 weeks ago - course seemed improving until last 48h - now worse with increase sputum - no wheeze or chest involvement/asthma flare   Past Medical History  Diagnosis Date  . ASTHMA   . GERD   . Achilles tendinitis on left   . Patellar tendinitis   . HALLUX RIGIDUS, ACQUIRED   . SACROILIAC JOINT DYSFUNCTION   . Hypertension    Review of Systems  Respiratory: Negative for shortness of breath.   Cardiovascular: Negative for chest pain or edema.  Neurological: Negative for dizziness or headache       Objective:   Physical Exam  BP 120/82  Pulse 52  Temp(Src) 99.1 F (37.3 C) (Oral)  Ht 5\' 5"  (1.651 m)  Wt 162 lb (73.483 kg)  BMI 26.96 kg/m2  SpO2 97%  Constitutional: He appears well-developed and well-nourished. No distress.  HENT: NCAT with tenderness over maxillary region L>R, nares with thick discharge; OP mod erythema ,wo exudate - B TMs ok without effusion Neck: Normal range of motion. Neck supple. No JVD present. No thyromegaly present.  Cardiovascular: Normal rate, regular rhythm and normal heart sounds.  No murmur heard. no BLE edema Pulmonary/Chest: Effort normal and breath sounds normal. No respiratory distress. no wheezes.  Psychiatric: he has a normal mood and affect. behavior is normal. Judgment and thought content normal.    Lab Results  Component Value Date   WBC 6.4 01/11/2011   HGB 15.9 01/11/2011   HCT 45.9 01/11/2011   PLT 171.0 01/11/2011   CHOL 195 01/11/2011   TRIG 27.0 01/11/2011   HDL 78.80 01/11/2011   ALT 29 01/11/2011   AST 27 01/11/2011   NA 138 01/11/2011   K 4.4 01/11/2011   CL  103 01/11/2011   CREATININE 0.7 01/11/2011   BUN 18 01/11/2011   CO2 26 01/11/2011   TSH 1.43 01/11/2011   PSA 0.73 01/11/2011       Assessment & Plan:  See problem list. Medications and labs reviewed today.  Also Sinusitus, acute - initially URI/allergy symptoms but now increase congestion/conug symptoms and productive sputum/nasal discharge - yellow green - not improved with OTC meds - LGF today - given duration >2 weeks, tx empiric Zpack and continue symptomatic tx as ongoing

## 2011-03-20 NOTE — Assessment & Plan Note (Signed)
New dx 01/2011-   FH same - already at ideal BMI, exercises aggressively -  ongoing sodium reduction but also started low dose ARB -01/2011 The current medical regimen is effective;  continue present plan and medications.  BP Readings from Last 3 Encounters:  03/20/11 120/82  03/08/11 143/93  01/11/11 142/90

## 2011-03-29 ENCOUNTER — Other Ambulatory Visit: Payer: Self-pay

## 2011-03-29 NOTE — Telephone Encounter (Signed)
A user error has taken place: encounter opened in error, closed for administrative reasons.

## 2011-04-16 ENCOUNTER — Telehealth: Payer: Self-pay | Admitting: *Deleted

## 2011-04-16 DIAGNOSIS — I10 Essential (primary) hypertension: Secondary | ICD-10-CM

## 2011-04-16 MED ORDER — LOSARTAN POTASSIUM 50 MG PO TABS: 50.0000 mg | ORAL_TABLET | Freq: Every day | ORAL | Status: DC

## 2011-04-16 NOTE — Telephone Encounter (Signed)
Noted and agree - new prescription for losartan 50 prescribed electronically today. Patient to continue monitoring and call us as discussed if problems

## 2011-04-16 NOTE — Telephone Encounter (Signed)
Pt has monitored BP as instructed at last OV and states consistently 130s over 90s [this morning reading 156/84]. Requesting Rx to [facility] [[email or web address]] for double Losartan dosage [discussed at OV per Pt].

## 2011-04-16 NOTE — Telephone Encounter (Signed)
LMOM to inform patient. 

## 2011-05-18 ENCOUNTER — Telehealth: Payer: Self-pay | Admitting: *Deleted

## 2011-05-18 NOTE — Telephone Encounter (Signed)
Pt call and states family been having flu sxs. Now he has develop terrible cough. Advise pt he will need ov to be evaluated. Pt states he will call back to schedule appt if he can get a babysitter...05/18/11@10 ;31am/LMB

## 2011-05-22 ENCOUNTER — Ambulatory Visit (INDEPENDENT_AMBULATORY_CARE_PROVIDER_SITE_OTHER): Payer: BC Managed Care – PPO | Admitting: Internal Medicine

## 2011-05-22 ENCOUNTER — Encounter: Payer: Self-pay | Admitting: Internal Medicine

## 2011-05-22 VITALS — BP 102/64 | HR 58 | Temp 98.1°F | Ht 68.0 in | Wt 160.4 lb

## 2011-05-22 DIAGNOSIS — J45909 Unspecified asthma, uncomplicated: Secondary | ICD-10-CM

## 2011-05-22 DIAGNOSIS — I10 Essential (primary) hypertension: Secondary | ICD-10-CM

## 2011-05-22 DIAGNOSIS — R05 Cough: Secondary | ICD-10-CM

## 2011-05-22 MED ORDER — BUDESONIDE-FORMOTEROL FUMARATE 80-4.5 MCG/ACT IN AERO: 2.0000 | INHALATION_SPRAY | Freq: Two times a day (BID) | RESPIRATORY_TRACT | Status: DC

## 2011-05-22 MED ORDER — ALBUTEROL SULFATE HFA 108 (90 BASE) MCG/ACT IN AERS: 2.0000 | INHALATION_SPRAY | RESPIRATORY_TRACT | Status: DC | PRN

## 2011-05-22 MED ORDER — AZITHROMYCIN 250 MG PO TABS: ORAL_TABLET | ORAL | Status: AC

## 2011-05-22 NOTE — Patient Instructions (Signed)
Take all new medications as prescribed Continue all other medications as before  

## 2011-05-27 ENCOUNTER — Encounter: Payer: Self-pay | Admitting: Internal Medicine

## 2011-05-27 NOTE — Progress Notes (Signed)
  Subjective:    Patient ID: Cameron White, male    DOB: July 02, 1970, 40 y.o.   MRN: [id number]  HPI  Here with acute onset mild to mod 2-3 days ST, HA, general weakness and malaise, with prod cough greenish sputum, but Pt denies chest pain, increased sob or doe, wheezing, orthopnea, PND, increased LE swelling, palpitations, dizziness or syncope.  Pt denies new neurological symptoms such as new headache, or facial or extremity weakness or numbness   Pt denies polydipsia, polyuria.   Pt denies fever, wt loss, night sweats, loss of appetite, or other constitutional symptoms except for the above Past Medical History  Diagnosis Date  . ASTHMA   . GERD   . Achilles tendinitis on left   . Patellar tendinitis   . HALLUX RIGIDUS, ACQUIRED   . SACROILIAC JOINT DYSFUNCTION   . Hypertension    Past Surgical History  Procedure Date  . No past surgeries     reports that he has never smoked. He has never used smokeless tobacco. He reports that he drinks alcohol. He reports that he does not use illicit drugs. family history includes Alcohol abuse in his other; Heart disease in his paternal grandfather; and Hypertension in his father, other, and paternal grandfather. No Known Allergies Current Outpatient Prescriptions on File Prior to Visit  Medication Sig Dispense Refill  . albuterol (PROVENTIL HFA) 108 (90 BASE) MCG/ACT inhaler Inhale 2 puffs into the lungs every 4 (four) hours as needed.  1 Inhaler  5  . budesonide-formoterol (SYMBICORT) 80-4.5 MCG/ACT inhaler Inhale 2 puffs into the lungs 2 (two) times daily.  1 Inhaler  5  . losartan (COZAAR) 50 MG tablet Take 1 tablet (50 mg total) by mouth daily.  30 tablet  11  . nitroGLYCERIN (NITRODUR - DOSED IN MG/24 HR) 0.2 mg/hr Place onto the skin daily. You will use 1/4 patch daily to patellar tendon RT and to Achilles tendon left Replace daily      . omeprazole (PRILOSEC) 20 MG capsule Take 20 mg by mouth daily.         Review of Systems Review of  Systems  Constitutional: Negative for diaphoresis and unexpected weight change.  HENT: Negative for drooling and tinnitus.   Eyes: Negative for photophobia and visual disturbance.  Respiratory: Negative for choking and stridor.   Gastrointestinal: Negative for vomiting and blood in stool.  Genitourinary: Negative for hematuria and decreased urine volume.     Objective:   Physical Exam BP 102/64  Pulse 58  Temp(Src) 98.1 F (36.7 C) (Oral)  Ht 5\' 8"  (1.727 m)  Wt 160 lb 6 oz (72.746 kg)  BMI 24.39 kg/m2  SpO2 96% Physical Exam  VS noted, mild ill Constitutional: Pt appears well-developed and well-nourished.  HENT: Head: Normocephalic.  Right Ear: External ear normal.  Left Ear: External ear normal. \Bilat tm's mild erythema.  Sinus nontender.  Pharynx mild erythema Eyes: Conjunctivae and EOM are normal. Pupils are equal, round, and reactive to light.  Neck: Normal range of motion. Neck supple.  Cardiovascular: Normal rate and regular rhythm.   Pulmonary/Chest: Effort normal and breath sounds normal.  Neurological: Pt is alert. No cranial nerve deficit.  Skin: Skin is warm. No erythema.  Psychiatric: Pt behavior is normal. Thought content normal.     Assessment & Plan:

## 2011-05-27 NOTE — Assessment & Plan Note (Signed)
stable overall by hx and exam, most recent data reviewed with pt, and pt to continue medical treatment as before SpO2 Readings from Last 3 Encounters:  05/22/11 96%  03/20/11 97%  01/11/11 98%

## 2011-05-27 NOTE — Assessment & Plan Note (Signed)
stable overall by hx and exam, most recent data reviewed with pt, and pt to continue medical treatment as before BP Readings from Last 3 Encounters:  05/22/11 102/64  03/20/11 120/82  03/08/11 143/93

## 2011-05-27 NOTE — Assessment & Plan Note (Signed)
C/w acute bronchitis, Mild to mod, for antibx course,  to f/u any worsening symptoms or concerns

## 2012-04-14 ENCOUNTER — Ambulatory Visit (INDEPENDENT_AMBULATORY_CARE_PROVIDER_SITE_OTHER): Payer: BC Managed Care – PPO | Admitting: Sports Medicine

## 2012-04-14 ENCOUNTER — Encounter: Payer: Self-pay | Admitting: Sports Medicine

## 2012-04-14 VITALS — BP 130/78 | HR 55 | Ht 68.0 in | Wt 160.0 lb

## 2012-04-14 DIAGNOSIS — S76319A Strain of muscle, fascia and tendon of the posterior muscle group at thigh level, unspecified thigh, initial encounter: Secondary | ICD-10-CM

## 2012-04-14 DIAGNOSIS — IMO0002 Reserved for concepts with insufficient information to code with codable children: Secondary | ICD-10-CM

## 2012-04-14 NOTE — Progress Notes (Signed)
Patient ID: Cameron White, male   DOB: 13-Aug-1970, 41 y.o.   MRN: [id number]  SUBJECTIVE: Cameron White is a 41 y.o. male with PMHx of HTN presenting for acute RIGHT buttock / posterior thigh pain.  Yesterday, while playing tennis, he was sprinting for an outside ball and over-extended himself.  While planting and snapping around to return he heard and felt a pop in his posterior buttock/thigh.  He reports immediate pain but since he was near the end of the match he was able to finish but limited by pain.  Denies any swelling, ecchymosis or gross deformity at any point.  After initial event denies any additional popping.   He has been using alleve and ice as often as possible since original injury with limited response.  Pt can ambulate pain free but notes some discomfort with stairs and any exertion beyond a brisk-walk.  No major orthopedic history, prior achilles strain, patellar tendon strain(s) treated with nitroglycerin topically and conservative management.  He wears orthotics made by Dr. Darrick Penna @ [facility].  PMHx: HTN PSHx: Negative  Meds: Lorsartan  Allergies: None  Social: Non-smoker, occasional drinker No supplement use, OTC medications or illicit drugs No recent antibiotic use or recent illness Plays tennis approx 3 days per week with friends Regular runner, most runs < 5 miles   OBJECTIVE: Vital signs as noted above. Appearance: Alert, oriented well-appearing male.  Resting comfortably in room.  Hip/Knee exam:  - No gross abnormalities on inspection of bilateral knees and posterior thigh - AROM limited in hip flexion on RIGHT side secondary to pain/tightness to approx 60 degrees, LEFT approx 80 degrees.  Symmetric and appropriate in hip extension, IR, ER.   - Knee flexion/extension symmetric as well - PROM patient can achieve 90 degrees of hip flexion bilaterally before feeling uncomfortable - 5/5 strength in knee flexion/extension.  Some pain with resisted knee flexion  in supine position. - 5/5 strength in hip abduction, adduction bilaterally and hip flexion - On prone exam there is minimal tenderness to palpation of ischial tuberosity on RIGHT.  Pt with pain with resisted prone hip extension > resisted knee flexion.  Both cause some pain but strength 5/5. - Lachman's, Valgus/Varus stress testing negative on RIGHT knee - With patient standing and both knees extended there are no appreciable abnormalities in the contour or shape of posterior thigh's musculature  Posterior Thigh U/S (RIGHT): - Common hamstring origin was visualized at RIGHT ischial tuberosity.  No obvious abnormalities to include calcification, fluid collection or hypoechoic changes - No neovascularization seen on doppler of common origin or any portion of tendon. - Tendon's intact at common origin and all three hamstring muscle's followed from common origin to level of popliteal fossa without defect, edema, calcifications or hypoechoic changes.     ASSESSMENT: 1. RIGHT proximal hamstring strain at common origin, acute  PLAN: Pt with history and exam above suggestive of proximal hamstring tendon strain on RIGHT. No appreciable defects seen on exam or sonography.  Pt will be treated conservatively and was offered a body helix compression sleeve but he declined.  Also instructed and demonstrated eccentric hamstring exercises to be completed 3 sets of 15 with low weight initially and then slowly increase with time.  Pt instructed to avoid activities with sharp, explosive movements/cutting like tennis for 2-3 weeks but ok to return to running as symptoms allow. He will return in 3 -4 weeks as scheduled for orthotics.  -- Kenney Houseman, MS IV

## 2012-04-30 ENCOUNTER — Other Ambulatory Visit: Payer: Self-pay | Admitting: Internal Medicine

## 2012-05-13 ENCOUNTER — Ambulatory Visit (INDEPENDENT_AMBULATORY_CARE_PROVIDER_SITE_OTHER): Payer: BC Managed Care – PPO | Admitting: Sports Medicine

## 2012-05-13 DIAGNOSIS — Q667 Congenital pes cavus, unspecified foot: Secondary | ICD-10-CM

## 2012-05-13 DIAGNOSIS — M2020 Hallux rigidus, unspecified foot: Secondary | ICD-10-CM

## 2012-05-13 NOTE — Assessment & Plan Note (Signed)
With a cavus foot and previous injury she will get knee and joint pain without orthotics We made the orthotics today  Patient was fitted for a : standard, cushioned, semi-rigid orthotic. The orthotic was heated and afterward the patient stood on the orthotic blank positioned on the orthotic stand. The patient was positioned in subtalar neutral position and 10 degrees of ankle dorsiflexion in a weight bearing stance. After completion of molding, a stable base was applied to the orthotic blank. The blank was ground to a stable position for weight bearing. Size: 10 red eva (cut down to a 9 after molding) Base: blue EVA Posting: first ray only Additional orthotic padding: 0  Time 45 mins  Gait looked good after preparation with orthotics with a nice neutral strike in mild supination  He will return when necessary

## 2012-05-13 NOTE — Progress Notes (Signed)
Patient ID: Cameron White, male   DOB: 10-Feb-1971, 41 y.o.   MRN: [id number]  Patient returns for new orthotics. He has started having more left knee and ankle pain. He also has pain over his right great toe which has arthritis and limited motion. The orthotics have controlled that. However his current pair are several years old and are starting to wear down and he has started seeing a return of more pain.  Physical examination He shows very good alignment  Left Knee: Normal to inspection with no erythema or effusion or obvious bony abnormalities. Palpation normal with no warmth or joint line tenderness or patellar tenderness or condyle tenderness. ROM normal in flexion and extension and lower leg rotation. Ligaments with solid consistent endpoints including ACL, PCL, LCL, MCL. Negative Mcmurray's and provocative meniscal tests. Non painful patellar compression. Patellar and quadriceps tendons unremarkable. Hamstring and quadriceps strength is normal. Mild crepitation under the distal patellar pole with flexion more than 45  Left ankle examination is normal with stable ligaments and no sign of effusion  Degenerative changes in the right first MTP Partial fusion of the second and third toes bilaterally Mild degenerative changes in the left first MTP

## 2012-05-13 NOTE — Assessment & Plan Note (Signed)
We placed a first ray post on his right orthotic  This was comfortable and seemed to give him more normal push off

## 2012-07-14 ENCOUNTER — Encounter: Payer: Self-pay | Admitting: Internal Medicine

## 2012-07-14 ENCOUNTER — Ambulatory Visit (INDEPENDENT_AMBULATORY_CARE_PROVIDER_SITE_OTHER): Payer: BC Managed Care – PPO | Admitting: Internal Medicine

## 2012-07-14 VITALS — BP 112/70 | HR 58 | Temp 98.0°F | Wt 169.6 lb

## 2012-07-14 DIAGNOSIS — K219 Gastro-esophageal reflux disease without esophagitis: Secondary | ICD-10-CM

## 2012-07-14 DIAGNOSIS — R05 Cough: Secondary | ICD-10-CM

## 2012-07-14 DIAGNOSIS — I10 Essential (primary) hypertension: Secondary | ICD-10-CM

## 2012-07-14 MED ORDER — LOSARTAN POTASSIUM 50 MG PO TABS: 50.0000 mg | ORAL_TABLET | Freq: Every day | ORAL | Status: DC

## 2012-07-14 MED ORDER — OMEPRAZOLE 20 MG PO CPDR: 20.0000 mg | DELAYED_RELEASE_CAPSULE | Freq: Every day | ORAL | Status: DC

## 2012-07-14 NOTE — Patient Instructions (Signed)
It was good to see you today. We have reviewed your prior records including labs and tests today Resume omeprazole daily for the next 30 days, then every other day for 30 days, then as needed. Okay to use over-the-counter Pepcid or Zantac if needed for reflux and cough symptoms Refill on losartan for blood pressure as discussed Your prescription(s) have been submitted to your pharmacy. Please take as directed and contact our office if you believe you are having problem(s) with the medication(s). If continued cough symptoms despite medication, or if symptoms worse, please call for reevaluation as needed Please schedule followup in 6-12 months for blood pressure check and review, call sooner if problems.

## 2012-07-14 NOTE — Progress Notes (Signed)
  Subjective:    Patient ID: Cameron White, male    DOB: 10/26/1970, 42 y.o.   MRN: [id number]  Cough This is a recurrent problem. The current episode started more than 1 month ago. The problem has been waxing and waning. The problem occurs every few hours. The cough is non-productive. Associated symptoms include heartburn. Pertinent negatives include no chills, fever, hemoptysis, myalgias, nasal congestion, postnasal drip, sore throat, shortness of breath, weight loss or wheezing. The symptoms are aggravated by lying down. He has tried nothing for the symptoms. His past medical history is significant for asthma and environmental allergies. There is no history of COPD or emphysema.    patient is also here today for follow up   HTN: Started ARB 01/2011 for same - the patient reports compliance with medication(s) as prescribed. Denies adverse side effects. At gym, SBP still 130-140 (after workout)    Past Medical History  Diagnosis Date  . ASTHMA   . GERD   . Achilles tendinitis on left   . Patellar tendinitis   . HALLUX RIGIDUS, ACQUIRED   . SACROILIAC JOINT DYSFUNCTION   . Hypertension    Review of Systems  Constitutional: Negative for fever, chills and weight loss.  HENT: Negative for sore throat and postnasal drip.   Respiratory: Positive for cough. Negative for hemoptysis, shortness of breath and wheezing.   Gastrointestinal: Positive for heartburn.  Musculoskeletal: Negative for myalgias.  Hematological: Positive for environmental allergies.         Objective:   Physical Exam  BP 112/70  Pulse 58  Temp 98 F (36.7 C) (Oral)  Wt 169 lb 9.6 oz (76.93 kg)  SpO2 97% Wt Readings from Last 3 Encounters:  07/14/12 169 lb 9.6 oz (76.93 kg)  04/14/12 160 lb (72.576 kg)  05/22/11 160 lb 6 oz (72.746 kg)   Constitutional: He appears well-developed and well-nourished. No distress.  HENT: NCAT - no sinus tenderness, nares without discharge; OP mod erythema, no exudate - B TMs  ok without effusion Neck: Normal range of motion. Neck supple. No JVD present. No thyromegaly present.  Cardiovascular: Normal rate, regular rhythm and normal heart sounds.  No murmur heard. no BLE edema Pulmonary/Chest: Effort normal and breath sounds normal. No respiratory distress. no wheezes.  Abdomen: soft, NTND, +BS Psychiatric: he has a normal mood and affect. behavior is normal. Judgment and thought content normal.    Lab Results  Component Value Date   WBC 6.4 01/11/2011   HGB 15.9 01/11/2011   HCT 45.9 01/11/2011   PLT 171.0 01/11/2011   CHOL 195 01/11/2011   TRIG 27.0 01/11/2011   HDL 78.80 01/11/2011   ALT 29 01/11/2011   AST 27 01/11/2011   NA 138 01/11/2011   K 4.4 01/11/2011   CL 103 01/11/2011   CREATININE 0.7 01/11/2011   BUN 18 01/11/2011   CO2 26 01/11/2011   TSH 1.43 01/11/2011   PSA 0.73 01/11/2011       Assessment & Plan:  See problem list. Medications and labs reviewed today.  Cough - no red flags on hx/exam - dry in setting of increase GERD symptoms - O2 sats good and lungs clear on exam - resume PPI and pt to call if worse or unimproved

## 2012-07-14 NOTE — Assessment & Plan Note (Signed)
BP Readings from Last 3 Encounters:  07/14/12 112/70  04/14/12 130/78  05/22/11 102/64   The current medical regimen is effective;  continue present plan and medications.

## 2012-07-14 NOTE — Assessment & Plan Note (Signed)
Increase symptoms off PPI including dry cough Resume same - take qd x 30d, then qod, then consider H2B prn

## 2012-10-06 ENCOUNTER — Ambulatory Visit (INDEPENDENT_AMBULATORY_CARE_PROVIDER_SITE_OTHER): Payer: BC Managed Care – PPO | Admitting: Sports Medicine

## 2012-10-06 VITALS — BP 120/70 | Ht 68.0 in | Wt 160.0 lb

## 2012-10-06 DIAGNOSIS — M7750 Other enthesopathy of unspecified foot: Secondary | ICD-10-CM

## 2012-10-06 DIAGNOSIS — M7740 Metatarsalgia, unspecified foot: Secondary | ICD-10-CM

## 2012-10-06 NOTE — Progress Notes (Signed)
  Subjective:    Patient ID: Cameron White, male    DOB: [birth year], 42 y.o.   MRN: [id number]  HPI chief complaint: Right foot pain  Patient comes in today complaining of 3-4 weeks of right foot pain. He localizes all of his pain to the fourth metatarsal head at the plantar aspect of his foot. No trauma. Pain is most noticeable after running or with playing tennis. He has tried a couple of metatarsal pads on his orthotics but has not noticed much of a difference with these. He denies associated numbness or tingling in his toes. No pain across the dorsum of his foot. He's had similar problems in the past but just not quite as painful. Pain resolves at rest.  Interim medical history is unchanged    Review of Systems     Objective:   Physical Exam Well-developed, well-nourished. No acute distress, awake alert and oriented x3  Right foot: There is tenderness to palpation directly over the fourth metatarsal head at the plantar aspect of the foot. There is significant callus formation here as well. No tenderness to palpation across the dorsum of the foot. No soft tissue swelling. No pain with metatarsal squeeze. No palpable neuroma. Neurovascularly intact distally. Walking with a slight limp.  MSK ultrasound of the right foot: Attention to the distal fourth metatarsal. Images in both long and short view were obtained. There is no obvious stress fracture. There is some hypoechogenicity at the fourth metatarsal head at the plantar forefoot. There is increased neovascularity here as well. No obvious neuroma.       Assessment & Plan:  1. Right foot pain secondary to metatarsalgia (fourth metatarsal head)  I think the metatarsal pads are a little too small. We will instead try a metatarsal cookie to help unload the pressure off the fourth metatarsal head. These were placed onto his orthotics. Patient is a Product manager. Recommended that he take a couple of weeks off from tennis but he can  continue running every other day as tolerated until symptoms improve. I've also recommended ice at the end of her running. Followup if symptoms persist or worsen.

## 2012-10-16 ENCOUNTER — Telehealth: Payer: Self-pay

## 2012-10-16 NOTE — Telephone Encounter (Signed)
Pt called [facility] stating that his therapist recommended that he contact PCP to see if she would write rx  for a low dose buspar. Message routed to scheduler to set up appoint to discuss new rx

## 2012-10-17 ENCOUNTER — Encounter: Payer: Self-pay | Admitting: Internal Medicine

## 2012-10-17 ENCOUNTER — Ambulatory Visit (INDEPENDENT_AMBULATORY_CARE_PROVIDER_SITE_OTHER): Payer: BC Managed Care – PPO | Admitting: Internal Medicine

## 2012-10-17 VITALS — BP 120/82 | HR 57 | Temp 98.1°F | Wt 166.4 lb

## 2012-10-17 DIAGNOSIS — K219 Gastro-esophageal reflux disease without esophagitis: Secondary | ICD-10-CM

## 2012-10-17 DIAGNOSIS — R454 Irritability and anger: Secondary | ICD-10-CM | POA: Insufficient documentation

## 2012-10-17 DIAGNOSIS — I10 Essential (primary) hypertension: Secondary | ICD-10-CM

## 2012-10-17 MED ORDER — BUSPIRONE HCL 5 MG PO TABS: 5.0000 mg | ORAL_TABLET | Freq: Three times a day (TID) | ORAL | Status: DC

## 2012-10-17 NOTE — Assessment & Plan Note (Signed)
BP Readings from Last 3 Encounters:  10/17/12 120/82  10/06/12 120/70  07/14/12 112/70   The current medical regimen is effective;  continue present plan and medications.

## 2012-10-17 NOTE — Telephone Encounter (Signed)
Pt have an appt today with Felicity Coyer 10/17/12.

## 2012-10-17 NOTE — Assessment & Plan Note (Signed)
Working with counselor for same since 2013 - Exacerbated by stressors at home Current therapst Dedra Skeens has suggested Buspar trial Discussion with patient regarding various medical options for treatment of underlying anxiety including SSRIs and when necessary benzos Patient agrees to try 3 times a day low-dose BuSpar for the next 4-6 weeks - erx done we reviewed potential risk/benefit and possible side effects - pt understands and agrees to same   He will continue to work with his counselor on same and call if dose titration or off the medication needed

## 2012-10-17 NOTE — Progress Notes (Signed)
  Subjective:    Patient ID: Cameron White, male    DOB: 1971-06-10, 42 y.o.   MRN: [id number]  HPI  patient is here today for follow up - hypertension and gerd  Also see cc and PA student note  Complains of mood irritability: quick temper and anger Following with counselor for same since 2013 Exacerbated by stressors at home, uncertain of pending end point Advised to consider BuSpar medication for treatment of same   Past Medical History  Diagnosis Date  . ASTHMA   . GERD   . Achilles tendinitis on left   . Patellar tendinitis   . HALLUX RIGIDUS, ACQUIRED   . SACROILIAC JOINT DYSFUNCTION   . Hypertension    Review of Systems  Respiratory: Negative for shortness of breath and wheezing.   Cardiovascular: Negative for chest pain and leg swelling.  Neurological: Negative for headaches.         Objective:   Physical Exam  BP 120/82  Pulse 57  Temp(Src) 98.1 F (36.7 C) (Oral)  Wt 166 lb 6.4 oz (75.479 kg)  BMI 25.31 kg/m2  SpO2 97% Wt Readings from Last 3 Encounters:  10/17/12 166 lb 6.4 oz (75.479 kg)  10/06/12 160 lb (72.576 kg)  07/14/12 169 lb 9.6 oz (76.93 kg)   Constitutional: He appears well-developed and well-nourished. No distress.  Neck: Normal range of motion. Neck supple. No JVD present. No thyromegaly present.  Cardiovascular: Normal rate, regular rhythm and normal heart sounds.  No murmur heard. no BLE edema Pulmonary/Chest: Effort normal and breath sounds normal. No respiratory distress. no wheezes.  Psychiatric: he has a mildly anxious mood and affect. behavior is normal. Judgment and thought content normal.    Lab Results  Component Value Date   WBC 6.4 01/11/2011   HGB 15.9 01/11/2011   HCT 45.9 01/11/2011   PLT 171.0 01/11/2011   CHOL 195 01/11/2011   TRIG 27.0 01/11/2011   HDL 78.80 01/11/2011   ALT 29 01/11/2011   AST 27 01/11/2011   NA 138 01/11/2011   K 4.4 01/11/2011   CL 103 01/11/2011   CREATININE 0.7 01/11/2011   BUN 18 01/11/2011   CO2 26 01/11/2011    TSH 1.43 01/11/2011   PSA 0.73 01/11/2011       Assessment & Plan:  See problem list. Medications and labs reviewed today.

## 2012-10-17 NOTE — Telephone Encounter (Signed)
Left vm for pt to call back to schedule an appt

## 2012-10-17 NOTE — Assessment & Plan Note (Signed)
Increase symptoms off PPI including dry cough early 2014 Now controlled with prn omeprazole Also advised to use over-the-counter H2 blocker to help with control of symptoms as needed

## 2012-10-17 NOTE — Patient Instructions (Signed)
It was good to see you today. We have reviewed your prior records including labs and tests today Will begin low-dose Buspar 3 times daily as discussed and suggested by your counselor Your prescription(s) have been submitted to your pharmacy. Please take as directed and contact our office if you believe you are having problem(s) with the medication(s). Other medications reviewed, no additional changes recommended Okay to use Zantac or Pepcid in place of omeprazole for cough and reflux symptoms as needed If continued "irritability" symptoms despite medication, or if symptoms worse, please call for reevaluation or other medication as needed Please schedule followup in 6 months for blood pressure check, medical physical/labs and review; call sooner if problems.

## 2012-10-17 NOTE — Progress Notes (Signed)
  Subjective:    Patient ID: Cameron White, male    DOB: 1970/12/18, 42 y.o.   MRN: [id number]  HPI  Patient here today for follow-up of chronic medical conditions:  HTN:  Patient states that he is doing well.  No ADRs. No HA, nausea, light-headedness.  Cough:  Omeprazole seems to fixed the cough.  He is trying to ween off the omeprazole, but flare up of the GERD after coffee can bring the cough back.  Anxiety:  Patient states that he has been seeing a therapist for a couple years.  His current therapist Dedra Skeens recommended that he start Buspar.  He is hoping that there is something can help him sleep better.  Some nights he has difficulty falling asleep, some nights he can't stay asleep.  He is on the fence about whether he wants to take a medicine because he feels people around him haven't benefited from psych medications.      Review of Systems  Constitutional: Negative for fever, chills, activity change and fatigue.  Respiratory: Negative for cough, chest tightness and shortness of breath.   Cardiovascular: Negative for chest pain and palpitations.  Psychiatric/Behavioral: Positive for sleep disturbance, decreased concentration and agitation. Negative for suicidal ideas, self-injury and dysphoric mood. The patient is nervous/anxious. The patient is not hyperactive.   All other systems reviewed and are negative.       Objective:   Physical Exam  Nursing note and vitals reviewed. Constitutional: He is oriented to person, place, and time. He appears well-developed and well-nourished.  HENT:  Head: Normocephalic and atraumatic.  Eyes: Conjunctivae are normal. No scleral icterus.  Neck: Normal range of motion.  Cardiovascular: Normal rate, regular rhythm and normal heart sounds.  Exam reveals no gallop and no friction rub.   No murmur heard. Pulmonary/Chest: Effort normal and breath sounds normal. No respiratory distress. He has no wheezes. He has no rales. He exhibits no tenderness.   Neurological: He is alert and oriented to person, place, and time.  Skin: Skin is warm and dry.  Psychiatric: He has a normal mood and affect. His behavior is normal.          Assessment & Plan:  Patient here for f/u of chronical medical conditions:  1.  HTN:  Current therapy is effective.  No change recommended at this time.  2.  Cough:  Clinically resolved with omeprazole.  Begin trying to ween off omeprazole.  May use a pepcid or a zantac as needed to help ween off PPI.  3.  Anxiety:  Discussed possibility of SSRIs, rescue benzodiazepines, and buspar today.  After discussing risks and benefits we decided on Buspar 15 mg TID with meals.  Patient instructed to continue counseling for the next 4-6 weeks.    Patient to return in 6 weeks to discuss dose titration or discontinuation of the medicine at that time if medication ineffective.     I have personally reviewed this case with PA student. I also personally examined this patient. I agree with history and findings as documented above. I reviewed, discussed and approve of the assessment and plan as listed above. Rene Paci, MD

## 2012-11-12 ENCOUNTER — Encounter: Payer: Self-pay | Admitting: Sports Medicine

## 2012-11-12 ENCOUNTER — Ambulatory Visit (INDEPENDENT_AMBULATORY_CARE_PROVIDER_SITE_OTHER): Payer: BC Managed Care – PPO | Admitting: Sports Medicine

## 2012-11-12 VITALS — BP 130/82 | HR 47 | Ht 68.0 in | Wt 166.0 lb

## 2012-11-12 DIAGNOSIS — M7741 Metatarsalgia, right foot: Secondary | ICD-10-CM

## 2012-11-12 DIAGNOSIS — M7750 Other enthesopathy of unspecified foot: Secondary | ICD-10-CM

## 2012-11-12 NOTE — Patient Instructions (Addendum)
Good to see you You can continue activities as tolerated.  We made some adjustments to the orthotics again try to pad the area of pain.  I hope this helps.

## 2012-11-12 NOTE — Assessment & Plan Note (Signed)
Patient's pain is still mostly in the lateral aspect especially the fourth metatarsal head. Patient did have a transverse metatarsal pad added that floated the first toe. Patient felt that this was comfortable. We did also remove the metatarsal cookies in added small metatarsal pads to the lateral aspect of the sports insoles. Patient will try these changes and follow up on an as needed basis.

## 2012-11-12 NOTE — Progress Notes (Signed)
Patient is back for his right 4th MTP pain.    Patient had a cookie placed to unload the 4th but states did not get too much improvement. Still is able to play tennis and played a 3 set match the other day but still was painful. Does run and in 1 mile the pain seems to resolve some. Denies any new signs such as numbness or weakness. No night-time awakenings.  Patient was having more pain with the right first toe and does have a history of hallux rigidus.  Past medical history, social, surgical and family history all reviewed.   Physical Exam Blood pressure 130/82, pulse 47, height 5\' 8"  (1.727 m), weight 166 lb (75.297 kg). Gen: NAd, alert Right foot exam: Patient still has some mild tenderness to palpation directly over the fourth metatarsal head as well as between the fourth and fifth metatarsals. Most of the pain is on the plantar aspect not much on the dorsal aspect. There is no swelling noted today. No pain with metatarsal squeeze. Neurovascularly intact distally.  Patient's orthotics were changed taking a first metatarsal post and instead loaded the first toe with a transverse padding over the metatarsal heads.  Patient's gait was in a neutral position and patient was able to jog comfortably. Patient denied any new pain with new changes to orthotics today.

## 2013-04-20 ENCOUNTER — Ambulatory Visit (INDEPENDENT_AMBULATORY_CARE_PROVIDER_SITE_OTHER): Payer: BC Managed Care – PPO | Admitting: Internal Medicine

## 2013-04-20 ENCOUNTER — Encounter: Payer: Self-pay | Admitting: Internal Medicine

## 2013-04-20 VITALS — BP 126/82 | HR 67 | Temp 98.2°F

## 2013-04-20 DIAGNOSIS — J45909 Unspecified asthma, uncomplicated: Secondary | ICD-10-CM

## 2013-04-20 DIAGNOSIS — J45901 Unspecified asthma with (acute) exacerbation: Secondary | ICD-10-CM

## 2013-04-20 DIAGNOSIS — I10 Essential (primary) hypertension: Secondary | ICD-10-CM

## 2013-04-20 MED ORDER — LORATADINE 10 MG PO TABS: 10.0000 mg | ORAL_TABLET | Freq: Every day | ORAL | Status: DC | PRN

## 2013-04-20 MED ORDER — BUDESONIDE-FORMOTEROL FUMARATE 80-4.5 MCG/ACT IN AERO: 2.0000 | INHALATION_SPRAY | Freq: Two times a day (BID) | RESPIRATORY_TRACT | Status: DC

## 2013-04-20 MED ORDER — AZITHROMYCIN 250 MG PO TABS: ORAL_TABLET | ORAL | Status: DC

## 2013-04-20 MED ORDER — BENZONATATE 200 MG PO CAPS: 200.0000 mg | ORAL_CAPSULE | Freq: Three times a day (TID) | ORAL | Status: DC | PRN

## 2013-04-20 MED ORDER — ALBUTEROL SULFATE HFA 108 (90 BASE) MCG/ACT IN AERS: 2.0000 | INHALATION_SPRAY | RESPIRATORY_TRACT | Status: DC | PRN

## 2013-04-20 NOTE — Patient Instructions (Signed)
It was good to see you today.  Your annual flu shot was given and/or updated today.  If you develop worsening symptoms or fever, we can reconsider antibiotics, but it does not appear necessary to use antibiotics at this time. If needed, Zpak antibiotics prescription provided to you to fill if worse  Continue Symbicort and rescue albuterol inhaler as needed Also tessalon perles for cough symptoms Also claritin 10mg  once daily  Your prescription(s) have been submitted to your pharmacy. Please take as directed and contact our office if you believe you are having problem(s) with the medication(s).  Please schedule followup in 3-4 months for annual physical and labs, call sooner if problems.

## 2013-04-20 NOTE — Progress Notes (Signed)
Pre-visit discussion using our clinic review tool. No additional management support is needed unless otherwise documented below in the visit note.  

## 2013-04-20 NOTE — Progress Notes (Signed)
  Subjective:    Patient ID: Cameron White, male    DOB: 1971/05/19, 42 y.o.   MRN: [id number]  HPI  Patient here today for evaluation of cough.   Chronic medical issues also reviewed.  Cough started about 1.5 weeks ago.  Associated with rhinorrhea and sinus pressure which have resolved.  Cough is non productive but persistent.  Worse when laying down.  He has tried OTC Delsym without relief.  Asthma - generally well controlled off medications since 2013.  He resumed his Symbicort and Albuterol in the last week with cough.  HTN - on Losartan.  Compliant with treatment plan. No adverse effects.  Denies headaches, other CV symptoms.  Past Medical History  Diagnosis Date  . ASTHMA   . GERD   . Achilles tendinitis on left   . Patellar tendinitis   . HALLUX RIGIDUS, ACQUIRED   . SACROILIAC JOINT DYSFUNCTION   . Hypertension      Review of Systems  Constitutional: Negative for fever and fatigue.  Respiratory: Positive for cough. Negative for shortness of breath and wheezing.   Cardiovascular: Negative for chest pain, palpitations and leg swelling.       Objective:   Physical Exam  Constitutional: He appears well-developed and well-nourished. No distress.  Eyes: Conjunctivae are normal. Right eye exhibits no discharge. Left eye exhibits no discharge.  Neck: Normal range of motion. Neck supple.  Cardiovascular: Normal rate, regular rhythm and normal heart sounds.  Exam reveals no friction rub.   No murmur heard. Pulmonary/Chest: Effort normal and breath sounds normal. No respiratory distress. He has no wheezes.  Musculoskeletal: He exhibits no edema.  Lymphadenopathy:    He has no cervical adenopathy.  Skin: He is not diaphoretic.    Wt Readings from Last 3 Encounters:  11/12/12 166 lb (75.297 kg)  10/17/12 166 lb 6.4 oz (75.479 kg)  10/06/12 160 lb (72.576 kg)   BP Readings from Last 3 Encounters:  04/20/13 126/82  11/12/12 130/82  10/17/12 120/82   Lab Results   Component Value Date   WBC 6.4 01/11/2011   HGB 15.9 01/11/2011   HCT 45.9 01/11/2011   PLT 171.0 01/11/2011   GLUCOSE 95 01/11/2011   CHOL 195 01/11/2011   TRIG 27.0 01/11/2011   HDL 78.80 01/11/2011   LDLCALC 111* 01/11/2011   ALT 29 01/11/2011   AST 27 01/11/2011   NA 138 01/11/2011   K 4.4 01/11/2011   CL 103 01/11/2011   CREATININE 0.7 01/11/2011   BUN 18 01/11/2011   CO2 26 01/11/2011   TSH 1.43 01/11/2011   PSA 0.73 01/11/2011       Assessment & Plan:   See problem list -   Acute asthma bronchitis - see below  Resume inhaled steroid and rescue inhaler as needed Add antihistamine for atopic seasonal component and given rx for Zpak to fill if worse

## 2013-04-20 NOTE — Assessment & Plan Note (Signed)
BP Readings from Last 3 Encounters:  04/20/13 126/82  11/12/12 130/82  10/17/12 120/82   The current medical regimen is effective;  continue present plan and medications.

## 2013-04-20 NOTE — Assessment & Plan Note (Addendum)
Flare of asthmatic bronchitis - eRX for Tessalon perles sent for cough.  Will give RX for Z-pak to take if symptoms not improved in 3-4 days.  Pt has been off Symbicort and Albuterol since 2013 - advised to resume these today.  eRX sent

## 2013-04-23 ENCOUNTER — Telehealth: Payer: Self-pay | Admitting: *Deleted

## 2013-04-23 NOTE — Telephone Encounter (Signed)
Pt called states he misplaced his Z Pac Rx.  Pt is requesting another Rx.  Please advise

## 2013-04-24 MED ORDER — AZITHROMYCIN 250 MG PO TABS: ORAL_TABLET | ORAL | Status: DC

## 2013-04-24 NOTE — Telephone Encounter (Signed)
erx for Zpak to local pharmacy -done

## 2013-04-24 NOTE — Telephone Encounter (Signed)
Spoke with pt advised rx sent. 

## 2013-05-01 ENCOUNTER — Ambulatory Visit (INDEPENDENT_AMBULATORY_CARE_PROVIDER_SITE_OTHER): Payer: BC Managed Care – PPO | Admitting: Internal Medicine

## 2013-05-01 ENCOUNTER — Encounter: Payer: Self-pay | Admitting: Internal Medicine

## 2013-05-01 VITALS — BP 130/82 | HR 69 | Temp 98.5°F

## 2013-05-01 DIAGNOSIS — J45901 Unspecified asthma with (acute) exacerbation: Secondary | ICD-10-CM

## 2013-05-01 DIAGNOSIS — J4531 Mild persistent asthma with (acute) exacerbation: Secondary | ICD-10-CM

## 2013-05-01 MED ORDER — HYDROCOD POLST-CHLORPHEN POLST 10-8 MG/5ML PO LQCR: 5.0000 mL | Freq: Every evening | ORAL | Status: DC | PRN

## 2013-05-01 MED ORDER — PREDNISONE (PAK) 10 MG PO TABS: ORAL_TABLET | ORAL | Status: DC

## 2013-05-01 NOTE — Progress Notes (Signed)
  Subjective:    Patient ID: Cameron White, male    DOB: [birth year], 42 y.o.   MRN: [id number]  HPI  complains of continued cough Worse with cold air and end of day Cough dry Seen for same 10d ago -  Cough improved but not resolved with tx Reports compliance with asthma and allergy meds as rx'd  Past Medical History  Diagnosis Date  . ASTHMA   . GERD   . Achilles tendinitis on left   . Patellar tendinitis   . HALLUX RIGIDUS, ACQUIRED   . SACROILIAC JOINT DYSFUNCTION   . Hypertension      Review of Systems  Constitutional: Negative for fever, chills, activity change, fatigue and unexpected weight change.  HENT: Negative for postnasal drip, sinus pressure and voice change.   Respiratory: Positive for cough. Negative for chest tightness, shortness of breath and stridor. Wheezing: occ.   Cardiovascular: Negative for chest pain, palpitations and leg swelling.       Objective:   Physical Exam BP 130/82  Pulse 69  Temp(Src) 98.5 F (36.9 C) (Oral)  SpO2 96% Wt Readings from Last 3 Encounters:  11/12/12 166 lb (75.297 kg)  10/17/12 166 lb 6.4 oz (75.479 kg)  10/06/12 160 lb (72.576 kg)    Constitutional: he appears well-developed and well-nourished. No distress. nontoxic.  HENT: Head: Normocephalic and atraumatic. Ears: B TMs ok, no erythema or effusion; Nose: Nose normal. Mouth/Throat: Oropharynx is clear and moist. No oropharyngeal exudate.  Eyes: Conjunctivae and EOM are normal. Pupils are equal, round, and reactive to light. No scleral icterus.  Neck: Normal range of motion. Neck supple. No JVD present. No thyromegaly present.  Cardiovascular: Normal rate, regular rhythm and normal heart sounds.  No murmur heard. No BLE edema. Pulmonary/Chest: Effort normal and breath sounds normal. No respiratory distress. he has no wheezes.  Psychiatric: he has a normal mood and affect. behavior is normal. Judgment and thought content normal.  Lab Results  Component Value Date   WBC 6.4 01/11/2011   HGB 15.9 01/11/2011   HCT 45.9 01/11/2011   PLT 171.0 01/11/2011   GLUCOSE 95 01/11/2011   CHOL 195 01/11/2011   TRIG 27.0 01/11/2011   HDL 78.80 01/11/2011   LDLCALC 111* 01/11/2011   ALT 29 01/11/2011   AST 27 01/11/2011   NA 138 01/11/2011   K 4.4 01/11/2011   CL 103 01/11/2011   CREATININE 0.7 01/11/2011   BUN 18 01/11/2011   CO2 26 01/11/2011   TSH 1.43 01/11/2011   PSA 0.73 01/11/2011        Assessment & Plan:   Mild persisting asthma exacerbation  improved but not resolved following tx asthmatic bronchitis 10 days ago residual reactive airways with cough  Tx with 6 day pred pak in addition to ongoing Symbicort, Alb prn and antihistamine Also nocturnal cough suppression with hydrocodone syrup  No fever, sputum, hemoptysis, pleurisy, weight loss or hypoxia so will forego CXR at this time but pt understands need for same if symptoms fail to respond

## 2013-05-01 NOTE — Patient Instructions (Addendum)
It was good to see you today.  If you develop worsening symptoms or fever, we can reconsider antibiotics, but it does not appear necessary to use antibiotics at this time.  Medications reviewed and updated Prednisone taper over next 6 daysays and Hydrocodone syrup at night x 1 week, then as needed Continue Symbicort and rescue albuterol inhaler as needed Also claritin 10mg  once daily  Your prescription(s) have been given to you and/or submitted to your pharmacy. Please take as directed and contact our office if you believe you are having problem(s) with the medication(s).  Please keep scheduled followup for annual physical and labs, call sooner if problems.  Asthma Attack Prevention Although there is no way to prevent asthma from starting, you can take steps to control the disease and reduce its symptoms. Learn about your asthma and how to control it. Take an active role to control your asthma by working with your health care provider to create and follow an asthma action plan. An asthma action plan guides you in:  Taking your medicines properly.  Avoiding things that set off your asthma or make your asthma worse (asthma triggers).  Tracking your level of asthma control.  Responding to worsening asthma.  Seeking emergency care when needed. To track your asthma, keep records of your symptoms, check your peak flow number using a handheld device that shows how well air moves out of your lungs (peak flow meter), and get regular asthma checkups.  WHAT ARE SOME WAYS TO PREVENT AN ASTHMA ATTACK?  Take medicines as directed by your health care provider.  Keep track of your asthma symptoms and level of control.  With your health care provider, write a detailed plan for taking medicines and managing an asthma attack. Then be sure to follow your action plan. Asthma is an ongoing condition that needs regular monitoring and treatment.  Identify and avoid asthma triggers. Many outdoor allergens  and irritants (such as pollen, mold, cold air, and air pollution) can trigger asthma attacks. Find out what your asthma triggers are and take steps to avoid them.  Monitor your breathing. Learn to recognize warning signs of an attack, such as coughing, wheezing, or shortness of breath. Your lung function may decrease before you notice any signs or symptoms, so regularly measure and record your peak airflow with a home peak flow meter.  Identify and treat attacks early. If you act quickly, you are less likely to have a severe attack. You will also need less medicine to control your symptoms. When your peak flow measurements decrease and alert you to an upcoming attack, take your medicine as instructed and immediately stop any activity that may have triggered the attack. If your symptoms do not improve, get medical help.  Pay attention to increasing quick-relief inhaler use. If you find yourself relying on your quick-relief inhaler, your asthma is not under control. See your health care provider about adjusting your treatment. WHAT CAN MAKE MY SYMPTOMS WORSE? A number of common things can set off or make your asthma symptoms worse and cause temporary increased inflammation of your airways. Keep track of your asthma symptoms for several weeks, detailing all the environmental and emotional factors that are linked with your asthma. When you have an asthma attack, go back to your asthma diary to see which factor, or combination of factors, might have contributed to it. Once you know what these factors are, you can take steps to control many of them. If you have allergies and asthma, it is  important to take asthma prevention steps at home. Minimizing contact with the substance to which you are allergic will help prevent an asthma attack. Some triggers and ways to avoid these triggers are: Animal Dander:  Some people are allergic to the flakes of skin or dried saliva from animals with fur or feathers.   There is  no such thing as a hypoallergenic dog or cat breed. All dogs or cats can cause allergies, even if they don't shed.  Keep these pets out of your home.  If you are not able to keep a pet outdoors, keep the pet out of your bedroom and other sleeping areas at all times, and keep the door closed.  Remove carpets and furniture covered with cloth from your home. If that is not possible, keep the pet away from fabric-covered furniture and carpets. Dust Mites: Many people with asthma are allergic to dust mites. Dust mites are tiny bugs that are found in every home in mattresses, pillows, carpets, fabric-covered furniture, bedcovers, clothes, stuffed toys, and other fabric-covered items.   Cover your mattress in a special dust-proof cover.  Cover your pillow in a special dust-proof cover, or wash the pillow each week in hot water. Water must be hotter than 130 F (54.4 C) to kill dust mites. Cold or warm water used with detergent and bleach can also be effective.  Wash the sheets and blankets on your bed each week in hot water.  Try not to sleep or lie on cloth-covered cushions.  Call ahead when traveling and ask for a smoke-free hotel room. Bring your own bedding and pillows in case the hotel only supplies feather pillows and down comforters, which may contain dust mites and cause asthma symptoms.  Remove carpets from your bedroom and those laid on concrete, if you can.  Keep stuffed toys out of the bed, or wash the toys weekly in hot water or cooler water with detergent and bleach. Cockroaches: Many people with asthma are allergic to the droppings and remains of cockroaches.   Keep food and garbage in closed containers. Never leave food out.  Use poison baits, traps, powders, gels, or paste (for example, boric acid).  If a spray is used to kill cockroaches, stay out of the room until the odor goes away. Indoor Mold:  Fix leaky faucets, pipes, or other sources of water that have mold around  them.  Clean floors and moldy surfaces with a fungicide or diluted bleach.  Avoid using humidifiers, vaporizers, or swamp coolers. These can spread molds through the air. Pollen and Outdoor Mold:  When pollen or mold spore counts are high, try to keep your windows closed.  Stay indoors with windows closed from late morning to afternoon. Pollen and some mold spore counts are highest at that time.  Ask your health care provider whether you need to take anti-inflammatory medicine or increase your dose of the medicine before your allergy season starts. Other Irritants to Avoid:  Tobacco smoke is an irritant. If you smoke, ask your health care provider how you can quit. Ask family members to quit smoking too. Do not allow smoking in your home or car.  If possible, do not use a wood-burning stove, kerosene heater, or fireplace. Minimize exposure to all sources of smoke, including to incense, candles, fires, and fireworks.  Try to stay away from strong odors and sprays, such as perfume, talcum powder, hair spray, and paints.  Decrease humidity in your home and use an indoor air cleaning device.  Reduce indoor humidity to below 60%. Dehumidifiers or central air conditioners can do this.  Decrease house dust exposure by changing furnace and air cooler filters frequently.  Try to have someone else vacuum for you once or twice a week. Stay out of rooms while they are being vacuumed and for a short while afterward.  If you vacuum, use a dust mask from a hardware store, a double-layered or microfilter vacuum cleaner bag, or a vacuum cleaner with a HEPA filter.  Sulfites in foods and beverages can be irritants. Do not drink beer or wine or eat dried fruit, processed potatoes, or shrimp if they cause asthma symptoms.  Cold air can trigger an asthma attack. Cover your nose and mouth with a scarf on cold or windy days.  Several health conditions can make asthma more difficult to manage, including a  runny nose, sinus infections, reflux disease, psychological stress, and sleep apnea. Work with your health care provider to manage these conditions.  Avoid close contact with people who have a respiratory infection such as a cold or the flu, since your asthma symptoms may get worse if you catch the infection. Wash your hands thoroughly after touching items that may have been handled by people with a respiratory infection.  Get a flu shot every year to protect against the flu virus, which often makes asthma worse for days or weeks. Also get a pneumonia shot if you have not previously had one. Unlike the flu shot, the pneumonia shot does not need to be given yearly. Medicines:  Talk to your health care provider about whether it is safe for you to take aspirin or non-steroidal anti-inflammatory medicines (NSAIDs). In a small number of people with asthma, aspirin and NSAIDs can cause asthma attacks. These medicines must be avoided by people who have known aspirin-sensitive asthma. It is important that people with aspirin-sensitive asthma read labels of all over-the-counter medicines used to treat pain, colds, coughs, and fever.  Beta blockers and ACE inhibitors are other medicines you should discuss with your health care provider. HOW CAN I FIND OUT WHAT I AM ALLERGIC TO? Ask your asthma health care provider about allergy skin testing or blood testing (the RAST test) to identify the allergens to which you are sensitive. If you are found to have allergies, the most important thing to do is to try to avoid exposure to any allergens that you are sensitive to as much as possible. Other treatments for allergies, such as medicines and allergy shots (immunotherapy) are available.  CAN I EXERCISE? Follow your health care provider's advice regarding asthma treatment before exercising. It is important to maintain a regular exercise program, but vigorous exercise, or exercise in cold, humid, or dry environments can  cause asthma attacks, especially for those people who have exercise-induced asthma. Document Released: 05/16/2009 Document Revised: 01/28/2013 Document Reviewed: 12/03/2012 [facility] Patient Information 2014 [place], [place].

## 2013-05-01 NOTE — Progress Notes (Signed)
Pre-visit discussion using our clinic review tool. No additional management support is needed unless otherwise documented below in the visit note.  

## 2013-07-29 ENCOUNTER — Other Ambulatory Visit: Payer: Self-pay | Admitting: Internal Medicine

## 2013-08-02 NOTE — Progress Notes (Signed)
Cameron FerrariVincent I White [id number] 08/03/2013   Chief Complaint  Patient presents with  . Annual Exam    Subjective  HPI  Here today for his annual cpe.  He is feeling well and does not have any acute concerns.    HTN - controlled on losartan 50mg  q day.  No CP or palpitations.  No lightheadedness or dizziness.  Asthma - No acute episodes, no SOB, no waking at night, not using rescue inhaler or symicort.  GERD - Not bothered by any symptoms.  He modified his coffee intake and is not taking any medications for this.   Has been seeing Dr. Katrinka BlazingSmith and Dr. Darrick PennaFields occasionally for orthopedic concerns.  No acute complaints today.   Past Medical History  Diagnosis Date  . ASTHMA   . GERD   . Achilles tendinitis on left   . Patellar tendinitis   . HALLUX RIGIDUS, ACQUIRED   . SACROILIAC JOINT DYSFUNCTION   . Hypertension     Past Surgical History  Procedure Laterality Date  . No past surgeries      Family History  Problem Relation Age of Onset  . Alcohol abuse Other     parent & grandparent  . Hypertension Other   . Hypertension Father   . Heart disease Paternal Grandfather   . Hypertension Paternal Grandfather     History  Substance Use Topics  . Smoking status: Never Smoker   . 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 Alcohol Use: Yes     Comment: social    Current Outpatient Prescriptions on File Prior to Visit  Medication Sig Dispense Refill  . albuterol (PROVENTIL HFA) 108 (90 BASE) MCG/ACT inhaler Inhale 2 puffs into the lungs every 4 (four) hours as needed.  1 Inhaler  5  . budesonide-formoterol (SYMBICORT) 80-4.5 MCG/ACT inhaler Inhale 2 puffs into the lungs 2 (two) times daily.  1 Inhaler  5  . losartan (COZAAR) 50 MG tablet TAKE 1 TABLET BY MOUTH DAILY  30 tablet  10   No current facility-administered medications on file prior to visit.    Allergies: No Known Allergies  Review of Systems    Constitutional: Negative for fever, chills, weight loss and malaise/fatigue.  HENT: Negative for ear discharge, ear pain and hearing loss.   Eyes: Negative for blurred vision and discharge.  Respiratory: Negative for cough and shortness of breath.   Cardiovascular: Negative for chest pain, palpitations and leg swelling.  Gastrointestinal: Negative for heartburn, nausea, vomiting, diarrhea, constipation and blood in stool.  Genitourinary: Negative for dysuria and urgency.  Musculoskeletal: Positive for joint pain (Mild joint stiffness in knees and ankles.  He is a runner.). Negative for neck pain.  Neurological: Negative for dizziness, sensory change and headaches.  Psychiatric/Behavioral: Negative for depression. The patient is not nervous/anxious.       Objective  Filed Vitals:   08/03/13 0814  BP: 110/72  Pulse: 47  Temp: 97.9 F (36.6 C)  TempSrc: Oral  Height: 5\' 8"  (1.727 m)  Weight: 169 lb 1.9 oz (76.712 kg)  SpO2: 97%    Physical Exam  Constitutional: He is oriented to person, place, and time. He appears well-developed and well-nourished. No distress.  Nontoxic  HENT:  Head: Normocephalic and atraumatic.  Mouth/Throat: Oropharynx is clear and moist. No oropharyngeal exudate.  Ears: TMs intact b/l, no erythema or effusion  Eyes: Conjunctivae and EOM are normal. Pupils are equal,  round, and reactive to light. No scleral icterus.  Neck: Normal range of motion. Neck supple. No JVD present. No thyromegaly present.  Cardiovascular: Normal rate and regular rhythm.  Exam reveals no gallop and no friction rub.   No murmur heard. No edema b/l   Respiratory: Effort normal and breath sounds normal. No stridor. No respiratory distress. He has no wheezes. He has no rales.  GI: Soft. Bowel sounds are normal. He exhibits no distension. There is no tenderness. There is no rebound and no guarding.  Genitourinary:  Deferred  Musculoskeletal: Normal range of motion. He exhibits no  edema and no tenderness.  Lymphadenopathy:    He has no cervical adenopathy.  Neurological: He is alert and oriented to person, place, and time. No cranial nerve deficit.  Skin: No rash noted. He is not diaphoretic. No erythema.  Psychiatric: He has a normal mood and affect. His behavior is normal. Judgment and thought content normal.     BP Readings from Last 3 Encounters:  08/03/13 110/72  05/01/13 130/82  04/20/13 126/82    Wt Readings from Last 3 Encounters:  08/03/13 169 lb 1.9 oz (76.712 kg)  11/12/12 166 lb (75.297 kg)  10/17/12 166 lb 6.4 oz (75.479 kg)    Lab Results  Component Value Date   WBC 6.4 01/11/2011   HGB 15.9 01/11/2011   HCT 45.9 01/11/2011   PLT 171.0 01/11/2011   GLUCOSE 95 01/11/2011   CHOL 195 01/11/2011   TRIG 27.0 01/11/2011   HDL 78.80 01/11/2011   LDLCALC 111* 01/11/2011   ALT 29 01/11/2011   AST 27 01/11/2011   NA 138 01/11/2011   K 4.4 01/11/2011   CL 103 01/11/2011   CREATININE 0.7 01/11/2011   BUN 18 01/11/2011   CO2 26 01/11/2011   TSH 1.43 01/11/2011   PSA 0.73 01/11/2011   EKG - 08/03/13 and 01/11/11 marked sinus brady @46bpm , unchanged abn since 08/09/09 with ST elev v1-v3    Assessment and Plan  EKG performed this morning - No changes from previous tests.  Annual labs due - CBC, CMP, TSH, Lipids, LFTs, UA, PSA  HTN - continue on losartan 50mg  q day.   Asthma - No acute episodes.  Report any changes.  GERD - treat prn with omeprazole.   Continue seeing Dr. Katrinka Blazing and Dr. Darrick Penna for orthopedic concerns.    Patient was instructed to notify the office if any new symptoms emerged or the current symptoms become worse.   Return in about 1 year (around 08/03/2014) for annual exam and labs.  Evie Lacks    I have personally reviewed this case with PA student. I also personally examined this patient. I agree with history and findings as documented above. I reviewed, discussed and approve of the assessment and plan as listed above. Rene Paci,  MD

## 2013-08-03 ENCOUNTER — Encounter: Payer: Self-pay | Admitting: Internal Medicine

## 2013-08-03 ENCOUNTER — Ambulatory Visit (INDEPENDENT_AMBULATORY_CARE_PROVIDER_SITE_OTHER): Payer: BC Managed Care – PPO | Admitting: Internal Medicine

## 2013-08-03 ENCOUNTER — Other Ambulatory Visit (INDEPENDENT_AMBULATORY_CARE_PROVIDER_SITE_OTHER): Payer: BC Managed Care – PPO

## 2013-08-03 VITALS — BP 110/72 | HR 47 | Temp 97.9°F | Ht 68.0 in | Wt 169.1 lb

## 2013-08-03 DIAGNOSIS — I10 Essential (primary) hypertension: Secondary | ICD-10-CM

## 2013-08-03 DIAGNOSIS — Z0000 Encounter for general adult medical examination without abnormal findings: Secondary | ICD-10-CM

## 2013-08-03 LAB — CBC WITH DIFFERENTIAL/PLATELET
BASOS PCT: 0.4 % (ref 0.0–3.0)
Basophils Absolute: 0 10*3/uL (ref 0.0–0.1)
EOS PCT: 1.4 % (ref 0.0–5.0)
Eosinophils Absolute: 0.1 10*3/uL (ref 0.0–0.7)
HCT: 47.4 % (ref 39.0–52.0)
HEMOGLOBIN: 16.3 g/dL (ref 13.0–17.0)
Lymphocytes Relative: 29.3 % (ref 12.0–46.0)
Lymphs Abs: 1.7 10*3/uL (ref 0.7–4.0)
MCHC: 34.5 g/dL (ref 30.0–36.0)
MCV: 100.2 fl — ABNORMAL HIGH (ref 78.0–100.0)
MONO ABS: 0.4 10*3/uL (ref 0.1–1.0)
Monocytes Relative: 7.3 % (ref 3.0–12.0)
NEUTROS ABS: 3.5 10*3/uL (ref 1.4–7.7)
NEUTROS PCT: 61.6 % (ref 43.0–77.0)
Platelets: 166 10*3/uL (ref 150.0–400.0)
RBC: 4.73 Mil/uL (ref 4.22–5.81)
RDW: 13.5 % (ref 11.5–14.6)
WBC: 5.8 10*3/uL (ref 4.5–10.5)

## 2013-08-03 LAB — URINALYSIS, ROUTINE W REFLEX MICROSCOPIC
Bilirubin Urine: NEGATIVE
Hgb urine dipstick: NEGATIVE
Ketones, ur: NEGATIVE
LEUKOCYTES UA: NEGATIVE
Nitrite: NEGATIVE
RBC / HPF: NONE SEEN (ref 0–?)
SPECIFIC GRAVITY, URINE: 1.02 (ref 1.000–1.030)
Total Protein, Urine: NEGATIVE
URINE GLUCOSE: NEGATIVE
UROBILINOGEN UA: 0.2 (ref 0.0–1.0)
pH: 6 (ref 5.0–8.0)

## 2013-08-03 LAB — HEPATIC FUNCTION PANEL
ALT: 53 U/L (ref 0–53)
AST: 40 U/L — ABNORMAL HIGH (ref 0–37)
Albumin: 4.2 g/dL (ref 3.5–5.2)
Alkaline Phosphatase: 84 U/L (ref 39–117)
BILIRUBIN DIRECT: 0.1 mg/dL (ref 0.0–0.3)
TOTAL PROTEIN: 6.9 g/dL (ref 6.0–8.3)
Total Bilirubin: 0.9 mg/dL (ref 0.3–1.2)

## 2013-08-03 LAB — LIPID PANEL
Cholesterol: 174 mg/dL (ref 0–200)
HDL: 62.4 mg/dL (ref >39.00)
LDL Cholesterol: 102 mg/dL — ABNORMAL HIGH (ref 0–99)
Total CHOL/HDL Ratio: 3
Triglycerides: 49 mg/dL (ref 0.0–149.0)
VLDL: 9.8 mg/dL (ref 0.0–40.0)

## 2013-08-03 LAB — TSH: TSH: 1.23 u[IU]/mL (ref 0.35–5.50)

## 2013-08-03 LAB — BASIC METABOLIC PANEL
BUN: 16 mg/dL (ref 6–23)
CALCIUM: 9.5 mg/dL (ref 8.4–10.5)
CO2: 30 meq/L (ref 19–32)
CREATININE: 0.7 mg/dL (ref 0.4–1.5)
Chloride: 105 mEq/L (ref 96–112)
GFR: 134.02 mL/min (ref >60.00)
GLUCOSE: 93 mg/dL (ref 70–99)
Potassium: 4.3 mEq/L (ref 3.5–5.1)
SODIUM: 139 meq/L (ref 135–145)

## 2013-08-03 LAB — PSA: PSA: 0.91 ng/mL (ref 0.10–4.00)

## 2013-08-03 NOTE — Patient Instructions (Addendum)
It was good to see you today.  We have reviewed your prior records including labs and tests today  Health Maintenance reviewed - all recommended immunizations and age-appropriate screenings are up-to-date.  Test(s) ordered today. Your results will be released to MyChart (or called to you) after review, usually within 72hours after test completion. If any changes need to be made, you will be notified at that same time.  Medications reviewed and updated, no changes recommended at this time.  Please schedule followup in 12 months for annual exam and labs, call sooner if problems.  Health Maintenance, Males A healthy lifestyle and preventative care can promote health and wellness.  Maintain regular health, dental, and eye exams.  Eat a healthy diet. Foods like vegetables, fruits, whole grains, low-fat dairy products, and lean protein foods contain the nutrients you need and are low in calories. Decrease your intake of foods high in solid fats, added sugars, and salt. Get information about a proper diet from your health care provider, if necessary.  Regular physical exercise is one of the most important things you can do for your health. Most adults should get at least 150 minutes of moderate-intensity exercise (any activity that increases your heart rate and causes you to sweat) each week. In addition, most adults need muscle-strengthening exercises on 2 or more days a week.   Maintain a healthy weight. The body mass index (BMI) is a screening tool to identify possible weight problems. It provides an estimate of body fat based on height and weight. Your health care provider can find your BMI and can help you achieve or maintain a healthy weight. For males 20 years and older:  A BMI below 18.5 is considered underweight.  A BMI of 18.5 to 24.9 is normal.  A BMI of 25 to 29.9 is considered overweight.  A BMI of 30 and above is considered obese.  Maintain normal blood lipids and cholesterol  by exercising and minimizing your intake of saturated fat. Eat a balanced diet with plenty of fruits and vegetables. Blood tests for lipids and cholesterol should begin at age 20 and be repeated every 5 years. If your lipid or cholesterol levels are high, you are over 50, or you are at high risk for heart disease, you may need your cholesterol levels checked more frequently.Ongoing high lipid and cholesterol levels should be treated with medicines, if diet and exercise are not working.  If you smoke, find out from your health care provider how to quit. If you do not use tobacco, do not start.  Lung cancer screening is recommended for adults aged 55 80 years who are at high risk for developing lung cancer because of a history of smoking. A yearly low-dose CT scan of the lungs is recommended for people who have at least a 30-pack-year history of smoking and are a current smoker or have quit within the past 15 years. A pack year of smoking is smoking an average of 1 pack of cigarettes a day for 1 year (for example, a 30-pack-year history of smoking could mean smoking 1 pack a day for 30 years or 2 packs a day for 15 years). Yearly screening should continue until the smoker has stopped smoking for at least 15 years. Yearly screening should be stopped for people who develop a health problem that would prevent them from having lung cancer treatment.  If you choose to drink alcohol, do not have more than 2 drinks per day. One drink is considered to be   12 oz (360 mL) of beer, 5 oz (150 mL) of wine, or 1.5 oz (45 mL) of liquor.  Avoid use of street drugs. Do not share needles with anyone. Ask for help if you need support or instructions about stopping the use of drugs.  High blood pressure causes heart disease and increases the risk of stroke. Blood pressure should be checked at least every 1 2 years. Ongoing high blood pressure should be treated with medicines if weight loss and exercise are not effective.  If  you are 45 45 years old, ask your health care provider if you should take aspirin to prevent heart disease.  Diabetes screening involves taking a blood sample to check your fasting blood sugar level. This should be done once every 3 years after age 45, if you are at a normal weight and without risk factors for diabetes. Testing should be considered at a younger age or be carried out more frequently if you are overweight and have at least 1 risk factor for diabetes.  Colorectal cancer can be detected and often prevented. Most routine colorectal cancer screening begins at the age of 50 and continues through age 75. However, your health care provider may recommend screening at an earlier age if you have risk factors for colon cancer. On a yearly basis, your health care provider may provide home test kits to check for hidden blood in the stool. A small camera at the end of a tube may be used to directly examine the colon (sigmoidoscopy or colonoscopy) to detect the earliest forms of colorectal cancer. Talk to your health care provider about this at age 50, when routine screening begins. A direct exam of the colon should be repeated every 5 10 years through age 75, unless early forms of pre-cancerous polyps or small growths are found.  People who are at an increased risk for hepatitis B should be screened for this virus. You are considered at high risk for hepatitis B if:  You were born in a country where hepatitis B occurs often. Talk with your health care provider about which countries are considered high-risk.  Your parents were born in a high-risk country and you have not received a shot to protect against hepatitis B (hepatitis B vaccine).  You have HIV or AIDS.  You use needles to inject street drugs.  You live with, or have sex with, someone who has hepatitis B.  You are a man who has sex with other men (MSM).  You get hemodialysis treatment.  You take certain medicines for conditions like  cancer, organ transplantation, and autoimmune conditions.  Hepatitis C blood testing is recommended for all people born from 1945 through 1965 and any individual with known risk factors for hepatitis C.  Healthy men should no longer receive prostate-specific antigen (PSA) blood tests as part of routine cancer screening. Talk to your health care provider about prostate cancer screening.  Testicular cancer screening is not recommended for adolescents or adult males who have no symptoms. Screening includes self-exam, a health care provider exam, and other screening tests. Consult with your health care provider about any symptoms you have or any concerns you have about testicular cancer.  Practice safe sex. Use condoms and avoid high-risk sexual practices to reduce the spread of sexually transmitted infections (STIs).  Use sunscreen. Apply sunscreen liberally and repeatedly throughout the day. You should seek shade when your shadow is shorter than you. Protect yourself by wearing long sleeves, pants, a wide-brimmed hat, and sunglasses   year round, whenever you are outdoors.  Tell your health care provider of new moles or changes in moles, especially if there is a change in shape or color. Also tell your provider if a mole is larger than the size of a pencil eraser.  A one-time screening for abdominal aortic aneurysm (AAA) and surgical repair of large AAAs by ultrasound is recommended for men aged 65 75 years who are current or former smokers.  Stay current with your vaccines (immunizations). Document Released: 11/24/2007 Document Revised: 03/18/2013 Document Reviewed: 10/23/2010 Exit[employer] Patient Information 2014 [employer]. Health Maintenance, Males A healthy lifestyle and preventative care can promote health and wellness.  Maintain regular health, dental, and eye exams.  Eat a healthy diet. Foods like vegetables, fruits, whole grains, low-fat dairy products, and lean protein foods contain  the nutrients you need and are low in calories. Decrease your intake of foods high in solid fats, added sugars, and salt. Get information about a proper diet from your health care provider, if necessary.  Regular physical exercise is one of the most important things you can do for your health. Most adults should get at least 150 minutes of moderate-intensity exercise (any activity that increases your heart rate and causes you to sweat) each week. In addition, most adults need muscle-strengthening exercises on 2 or more days a week.   Maintain a healthy weight. The body mass index (BMI) is a screening tool to identify possible weight problems. It provides an estimate of body fat based on height and weight. Your health care provider can find your BMI and can help you achieve or maintain a healthy weight. For males 20 years and older:  A BMI below 18.5 is considered underweight.  A BMI of 18.5 to 24.9 is normal.  A BMI of 25 to 29.9 is considered overweight.  A BMI of 30 and above is considered obese.  Maintain normal blood lipids and cholesterol by exercising and minimizing your intake of saturated fat. Eat a balanced diet with plenty of fruits and vegetables. Blood tests for lipids and cholesterol should begin at age 20 and be repeated every 5 years. If your lipid or cholesterol levels are high, you are over 50, or you are at high risk for heart disease, you may need your cholesterol levels checked more frequently.Ongoing high lipid and cholesterol levels should be treated with medicines, if diet and exercise are not working.  If you smoke, find out from your health care provider how to quit. If you do not use tobacco, do not start.  Lung cancer screening is recommended for adults aged 55 80 years who are at high risk for developing lung cancer because of a history of smoking. A yearly low-dose CT scan of the lungs is recommended for people who have at least a 30-pack-year history of smoking and  are a current smoker or have quit within the past 15 years. A pack year of smoking is smoking an average of 1 pack of cigarettes a day for 1 year (for example, a 30-pack-year history of smoking could mean smoking 1 pack a day for 30 years or 2 packs a day for 15 years). Yearly screening should continue until the smoker has stopped smoking for at least 15 years. Yearly screening should be stopped for people who develop a health problem that would prevent them from having lung cancer treatment.  If you choose to drink alcohol, do not have more than 2 drinks per day. One drink is considered to   be 12 oz (360 mL) of beer, 5 oz (150 mL) of wine, or 1.5 oz (45 mL) of liquor.  Avoid use of street drugs. Do not share needles with anyone. Ask for help if you need support or instructions about stopping the use of drugs.  High blood pressure causes heart disease and increases the risk of stroke. Blood pressure should be checked at least every 1 2 years. Ongoing high blood pressure should be treated with medicines if weight loss and exercise are not effective.  If you are 45 45 years old, ask your health care provider if you should take aspirin to prevent heart disease.  Diabetes screening involves taking a blood sample to check your fasting blood sugar level. This should be done once every 3 years after age 45, if you are at a normal weight and without risk factors for diabetes. Testing should be considered at a younger age or be carried out more frequently if you are overweight and have at least 1 risk factor for diabetes.  Colorectal cancer can be detected and often prevented. Most routine colorectal cancer screening begins at the age of 50 and continues through age 75. However, your health care provider may recommend screening at an earlier age if you have risk factors for colon cancer. On a yearly basis, your health care provider may provide home test kits to check for hidden blood in the stool. A small camera  at the end of a tube may be used to directly examine the colon (sigmoidoscopy or colonoscopy) to detect the earliest forms of colorectal cancer. Talk to your health care provider about this at age 50, when routine screening begins. A direct exam of the colon should be repeated every 5 10 years through age 75, unless early forms of pre-cancerous polyps or small growths are found.  People who are at an increased risk for hepatitis B should be screened for this virus. You are considered at high risk for hepatitis B if:  You were born in a country where hepatitis B occurs often. Talk with your health care provider about which countries are considered high-risk.  Your parents were born in a high-risk country and you have not received a shot to protect against hepatitis B (hepatitis B vaccine).  You have HIV or AIDS.  You use needles to inject street drugs.  You live with, or have sex with, someone who has hepatitis B.  You are a man who has sex with other men (MSM).  You get hemodialysis treatment.  You take certain medicines for conditions like cancer, organ transplantation, and autoimmune conditions.  Hepatitis C blood testing is recommended for all people born from 1945 through 1965 and any individual with known risk factors for hepatitis C.  Healthy men should no longer receive prostate-specific antigen (PSA) blood tests as part of routine cancer screening. Talk to your health care provider about prostate cancer screening.  Testicular cancer screening is not recommended for adolescents or adult males who have no symptoms. Screening includes self-exam, a health care provider exam, and other screening tests. Consult with your health care provider about any symptoms you have or any concerns you have about testicular cancer.  Practice safe sex. Use condoms and avoid high-risk sexual practices to reduce the spread of sexually transmitted infections (STIs).  Use sunscreen. Apply sunscreen  liberally and repeatedly throughout the day. You should seek shade when your shadow is shorter than you. Protect yourself by wearing long sleeves, pants, a wide-brimmed hat, and   sunglasses year round, whenever you are outdoors.  Tell your health care provider of new moles or changes in moles, especially if there is a change in shape or color. Also tell your provider if a mole is larger than the size of a pencil eraser.  A one-time screening for abdominal aortic aneurysm (AAA) and surgical repair of large AAAs by ultrasound is recommended for men aged 65 75 years who are current or former smokers.  Stay current with your vaccines (immunizations). Document Released: 11/24/2007 Document Revised: 03/18/2013 Document Reviewed: 10/23/2010 [employer] Patient Information 2014 [employer].  

## 2013-08-03 NOTE — Progress Notes (Signed)
Pre-visit discussion using our clinic review tool. No additional management support is needed unless otherwise documented below in the visit note.  

## 2013-08-03 NOTE — Progress Notes (Signed)
Subjective:    Patient ID: Cameron White, male    DOB: Jun 11, 1971, 43 y.o.   MRN: [id number]  HPI  patient is here today for annual physical. Patient feels well overall  Also reviewed chronic medical issues and interval medical events  Past Medical History  Diagnosis Date  . ASTHMA   . GERD   . Achilles tendinitis on left   . Patellar tendinitis   . HALLUX RIGIDUS, ACQUIRED   . SACROILIAC JOINT DYSFUNCTION   . Hypertension    Family History  Problem Relation Age of Onset  . Alcohol abuse Other     parent & grandparent  . Hypertension Other   . Hypertension Father   . Heart disease Paternal Grandfather   . Hypertension Paternal Grandfather    History  Substance Use Topics  . Smoking status: Never Smoker   . 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 Alcohol Use: Yes     Comment: social   Review of Systems  Constitutional: Negative for fever, activity change, appetite change, fatigue and unexpected weight change.  Respiratory: Negative for cough, chest tightness, shortness of breath and wheezing.   Cardiovascular: Negative for chest pain, palpitations and leg swelling.  Neurological: Negative for dizziness, weakness and headaches.  Psychiatric/Behavioral: Negative for dysphoric mood. The patient is not nervous/anxious.   All other systems reviewed and are negative.       Objective:   Physical Exam BP 110/72  Pulse 47  Temp(Src) 97.9 F (36.6 C) (Oral)  Ht 5\' 8"  (1.727 m)  Wt 169 lb 1.9 oz (76.712 kg)  BMI 25.72 kg/m2  SpO2 97% Wt Readings from Last 3 Encounters:  08/03/13 169 lb 1.9 oz (76.712 kg)  11/12/12 166 lb (75.297 kg)  10/17/12 166 lb 6.4 oz (75.479 kg)   Constitutional: he appears well-developed and well-nourished. No distress.  HENT: Head: Normocephalic and atraumatic. Ears: B TMs ok, no erythema or effusion; Nose: Nose normal. Mouth/Throat: Oropharynx is clear and moist. No  oropharyngeal exudate.  Eyes: Conjunctivae and EOM are normal. Pupils are equal, round, and reactive to light. No scleral icterus.  Neck: Normal range of motion. Neck supple. No JVD present. No thyromegaly present.  Cardiovascular: Normal rate, regular rhythm and normal heart sounds.  No murmur heard. No BLE edema. Pulmonary/Chest: Effort normal and breath sounds normal. No respiratory distress. he has no wheezes.  Abdominal: Soft. Bowel sounds are normal. he exhibits no distension. There is no tenderness. no masses Musculoskeletal: Normal range of motion, no joint effusions. No gross deformities Neurological: he is alert and oriented to person, place, and time. No cranial nerve deficit. Coordination, balance, strength, speech and gait are normal.  Skin: Skin is warm and dry. No rash noted. No erythema.  Psychiatric: he has a normal mood and affect. behavior is normal. Judgment and thought content normal.   Lab Results  Component Value Date   WBC 6.4 01/11/2011   HGB 15.9 01/11/2011   HCT 45.9 01/11/2011   PLT 171.0 01/11/2011   CHOL 195 01/11/2011   TRIG 27.0 01/11/2011   HDL 78.80 01/11/2011   ALT 29 01/11/2011   AST 27 01/11/2011   NA 138 01/11/2011   K 4.4 01/11/2011   CL 103 01/11/2011   CREATININE 0.7 01/11/2011   BUN 18 01/11/2011   CO2 26 01/11/2011   TSH 1.43 01/11/2011   PSA 0.73 01/11/2011    EKG -  sinus brady at 47 bpm - anterior lateral ST changes, unchanged since August 2012     Assessment & Plan:  CPX/ v70.0 - Patient has been counseled on age-appropriate routine health concerns for screening and prevention. These are reviewed and up-to-date. Immunizations are up-to-date or declined. Labs ordered and ECG reviewed.  Problem List Items Addressed This Visit   Hypertension (Chronic)      BP Readings from Last 3 Encounters:  08/03/13 110/72  05/01/13 130/82  04/20/13 126/82  started ARB 2012 for same -doing well The current medical regimen is effective;  continue present plan and medications.        Other Visit Diagnoses   Routine general medical examination at a health care facility    -  Primary    Unspecified essential hypertension        Relevant Orders       EKG 12-Lead (Completed)

## 2013-08-03 NOTE — Assessment & Plan Note (Signed)
BP Readings from Last 3 Encounters:  08/03/13 110/72  05/01/13 130/82  04/20/13 126/82  started ARB 2012 for same -doing well The current medical regimen is effective;  continue present plan and medications.

## 2013-10-13 ENCOUNTER — Ambulatory Visit (INDEPENDENT_AMBULATORY_CARE_PROVIDER_SITE_OTHER): Payer: BC Managed Care – PPO | Admitting: Sports Medicine

## 2013-10-13 ENCOUNTER — Encounter: Payer: Self-pay | Admitting: Sports Medicine

## 2013-10-13 VITALS — BP 137/87 | Ht 68.0 in | Wt 160.0 lb

## 2013-10-13 DIAGNOSIS — M7918 Myalgia, other site: Secondary | ICD-10-CM | POA: Insufficient documentation

## 2013-10-13 DIAGNOSIS — M7522 Bicipital tendinitis, left shoulder: Secondary | ICD-10-CM | POA: Insufficient documentation

## 2013-10-13 DIAGNOSIS — IMO0001 Reserved for inherently not codable concepts without codable children: Secondary | ICD-10-CM

## 2013-10-13 DIAGNOSIS — M7520 Bicipital tendinitis, unspecified shoulder: Secondary | ICD-10-CM

## 2013-10-13 NOTE — Patient Instructions (Signed)
Great to meet you!  Try the exercises that Dr. Darrick PennaFields gave you in the handouts, Come back as needed.

## 2013-10-13 NOTE — Assessment & Plan Note (Addendum)
Most likely due to strain of hip rotator muscle, most likely inferior gemellus - Discussed exercises and stretches in detail and provided handouts - follow up PRN

## 2013-10-13 NOTE — Progress Notes (Signed)
Patient ID: Cameron White, male   DOB: [birth year], 43 y.o.   MRN: [id number]    [facility] [address] [place], [place] [address] Phone: [phone] Fax: [phone]   Patient Name: Cameron White Date of Birth: [birth year] Medical Record Number: [id number] Gender: male Date of Encounter: 10/13/2013  History of Present Illness:  Cameron White is a 43 y.o. very pleasant male patient who presents with the following:  L buttock pain for 2-3 weeks. He describes it as an insidiously onset worsening L buttock pain exacerbated by running. He normally runs about 5-7 miles 4 times weekly and has been limited to approx 3 miles 3 times weekly. He states that yesterday it began hurting immediately when he started running and wasn't able to run at all. He describes it a a severe sharp L buttock pain.   L shoulder pain for 6-8 weeks. He states that he noticed it when he was bench pressing weights. It is a sharp pain over the deltoid of his L shoulder. He denies any traumatic event or injury. He has stopped letting the weights come down as far as he was at first  BL knee pain and ankle stiffness for months. He explains that he has ankle stiffness every morning that forces him to walk on the edges of his feet and causes BL knee pain. He denies any overt injury to his knee or ankle but the pain is irritating. The knee pain lasts until he can walk flatly on his feet.   Patient Active Problem List   Diagnosis Date Noted  . Irritability and anger   . Metatarsalgia 10/06/2012  . Cough 05/22/2011  . Hypertension   . Tendonitis, patellar 12/28/2010  . Achilles tendinitis on left 12/28/2010  . GERD 06/06/2010  . ASTHMA 08/09/2009  . HALLUX RIGIDUS, ACQUIRED 01/31/2009  . Talipes cavus 01/31/2009  . Patellar tendinitis 11/12/2006  . HIP PAIN 10/04/2006  . SACROILIAC JOINT DYSFUNCTION 10/04/2006  . CALF PAIN 10/04/2006   Past Medical History  Diagnosis Date  .  ASTHMA   . GERD   . Achilles tendinitis on left   . Patellar tendinitis   . HALLUX RIGIDUS, ACQUIRED   . SACROILIAC JOINT DYSFUNCTION   . Hypertension    Past Surgical History  Procedure Laterality Date  . No past surgeries     History  Substance Use Topics  . Smoking status: Never Smoker   . 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 Alcohol Use: Yes     Comment: social   Family History  Problem Relation Age of Onset  . Alcohol abuse Other     parent & grandparent  . Hypertension Other   . Hypertension Father   . Heart disease Paternal Grandfather   . Hypertension Paternal Grandfather    No Known Allergies  Medication list has been reviewed and updated.  Prior to Admission medications   Medication Sig Start Date End Date Taking? Authorizing Provider  albuterol (PROVENTIL HFA) 108 (90 BASE) MCG/ACT inhaler Inhale 2 puffs into the lungs every 4 (four) hours as needed. 04/20/13   Newt LukesValerie A Leschber, MD  budesonide-formoterol (SYMBICORT) 80-4.5 MCG/ACT inhaler Inhale 2 puffs into the lungs 2 (two) times daily. 04/20/13   Newt LukesValerie A Leschber, MD  losartan (COZAAR) 50 MG tablet TAKE 1 TABLET BY MOUTH DAILY 07/29/13   Newt LukesValerie A Leschber, MD    Review of Systems:  Per HPI  Physical Examination: Filed Vitals:   10/13/13 1011  BP: 137/87   Filed Vitals:   10/13/13 1011  Height: 5\' 8"  (1.727 m)  Weight: 160 lb (72.576 kg)   Body mass index is 24.33 kg/(m^2).  Gen: no acute distress, well groomed MSK: L Shoulder - No erythema, bruising, or gross deformity - Pain with stress applied to teh biceps tendon w positive speeds test and slight change on Yergason - negative empty can and hawkins test - ROM full with forward ext and abduction above his head L Leg/buttock - Full ROM, no pain at insertion of hamstring - strength 5/5 in all tests - pain to palpation and stress of hip rotator mucles L hip - Negative log  roll Knees - no gross deformity, bruising or erythema - ligamentously intact with lachman's - no joint line tenderness BL feet with high arches, no ankle tenderness to palpation  Assessment and Plan: See problem specific assessment and plan  Elenora GammaSamuel L Merdith Boyd, MD  Reviewed and edited  [facility]

## 2013-10-13 NOTE — Assessment & Plan Note (Signed)
Pain and weakness with load on biceps tendon on L - start strengthen exercises, handouts given - Follow up as needed

## 2013-10-28 ENCOUNTER — Ambulatory Visit (INDEPENDENT_AMBULATORY_CARE_PROVIDER_SITE_OTHER): Payer: BC Managed Care – PPO | Admitting: Sports Medicine

## 2013-10-28 ENCOUNTER — Encounter: Payer: Self-pay | Admitting: Sports Medicine

## 2013-10-28 VITALS — BP 156/92 | Ht 68.0 in | Wt 160.0 lb

## 2013-10-28 DIAGNOSIS — M76899 Other specified enthesopathies of unspecified lower limb, excluding foot: Secondary | ICD-10-CM

## 2013-10-28 DIAGNOSIS — IMO0001 Reserved for inherently not codable concepts without codable children: Secondary | ICD-10-CM

## 2013-10-28 DIAGNOSIS — M7918 Myalgia, other site: Secondary | ICD-10-CM

## 2013-10-28 DIAGNOSIS — M6580 Other synovitis and tenosynovitis, unspecified site: Secondary | ICD-10-CM

## 2013-10-28 MED ORDER — NITROGLYCERIN 0.2 MG/HR TD PT24: 0.2000 mg | MEDICATED_PATCH | Freq: Every day | TRANSDERMAL | Status: DC

## 2013-10-28 NOTE — Patient Instructions (Signed)
Hamstring rehab Diver 3 sets of 8 Extender is 5 sets of 8 Reverse lunge 3 x 15 - back on rt leg with weight on ankle Forward lunge 3 sets of 15 left forward with ankle weight Hamstring curls and swings - 3 sets of 15 with ankle weight  For 2 weeks no fast tennis moves but OK to hit  For running easy running may be OK watch downhill Use heel lifts Nitroglycerin Protocol   Apply 1/4 nitroglycerin patch to affected area daily.  Change position of patch within the affected area every 24 hours.  You may experience a headache during the first 1-2 weeks of using the patch, these should subside.  If you experience headaches after beginning nitroglycerin patch treatment, you may take your preferred over the counter pain reliever.  Another side effect of the nitroglycerin patch is skin irritation or rash related to patch adhesive.  Please notify our office if you develop more severe headaches or rash, and stop the patch.  Tendon healing with nitroglycerin patch may require 12 to 24 weeks depending on the extent of injury.  Men should not use if taking Viagra, Cialis, or Levitra.   Do not use if you have migraines or rosacea.   Wear hamstring sleeve for sports  Reck in 1 month

## 2013-10-28 NOTE — Assessment & Plan Note (Addendum)
Hamstring rehabilitation series  Compression sleeve for hamstring  Heel lifts for sports in regular shoes  Modify activities for at least the next 2 weeks and then ease back in with no sprinting orintense activities  NTG topical protocol

## 2013-10-28 NOTE — Progress Notes (Signed)
Patient ID: Cameron White, male   DOB: 04-02-1971, 43 y.o.   MRN: [id number]  Patient was seen on May 5 with an injury to his left buttocks that seemed to involve and hip rotators possibly the inferior gemellus muscle He was doing some exercises and massage Playing tennis 5 days ago he took a quick step backwards He felt a pop in the lower buttocks on the left side This felt very sore and he iced it 2 days later he tried to play tennis again and with a quick movement head significant pain and could not continue playing because of tenderness in the upper hamstring and buttocks  Examination No acute distress BP 156/92  Ht 5\' 8"  (1.727 m)  Wt 160 lb (72.576 kg)  BMI 24.33 kg/m2  Hip range of motion is full bilaterally Hip flexion strong Hip abduction strong Hip rotation is strong in 3 positions but he does feel some pain just above the ischial tuberosity Hamstring testing causes pain at the ischial tuberosity particularly with medial rotation of the foot  Ultrasound There is a hypoechoic area around the distal 1.5 cm above the hamstring attaching to the ischial tuberosity On transverse scan there is a hypoechoic area that looks to be in the central tendon and involves about 20% of the tendon A transverse scan above the ischial tuberosity there is also a hypoechoic area that seems to be in the insertion of the hip rotator probably the lesser gemellus

## 2013-10-28 NOTE — Assessment & Plan Note (Signed)
There seems to be a strain of the hip rotators probably the lesser gemellus  I recommended keeping up the massage and icing over the buttocks

## 2013-11-26 ENCOUNTER — Encounter: Payer: Self-pay | Admitting: Sports Medicine

## 2013-11-26 ENCOUNTER — Ambulatory Visit (INDEPENDENT_AMBULATORY_CARE_PROVIDER_SITE_OTHER): Payer: BC Managed Care – PPO | Admitting: Sports Medicine

## 2013-11-26 VITALS — BP 125/82 | Ht 68.0 in | Wt 160.0 lb

## 2013-11-26 DIAGNOSIS — M76899 Other specified enthesopathies of unspecified lower limb, excluding foot: Secondary | ICD-10-CM

## 2013-11-26 DIAGNOSIS — M7918 Myalgia, other site: Secondary | ICD-10-CM

## 2013-11-26 DIAGNOSIS — M6580 Other synovitis and tenosynovitis, unspecified site: Secondary | ICD-10-CM

## 2013-11-26 DIAGNOSIS — IMO0001 Reserved for inherently not codable concepts without codable children: Secondary | ICD-10-CM

## 2013-11-26 NOTE — Patient Instructions (Addendum)
Increase nitroglycerin from 1/4 to 1/2 patch for 6 weeks unless experience side effects  Start working in running drills including leaps

## 2013-11-26 NOTE — Assessment & Plan Note (Signed)
We will continue the nitroglycerin therapy for another 6 weeks just to see if we can get 100% tendon healing  Keep up hamstring series 3 days a week  At dynamic hamstring drills or

## 2013-11-26 NOTE — Assessment & Plan Note (Signed)
Pain is minimal   with no night pain

## 2013-11-26 NOTE — Progress Notes (Signed)
History was provided by the patient.  Cameron White is a 43 y.o. male who is here for followup of hamstring injury.     HPI:   Reports that his leg is doing better. If he plays tennis, he can feel it again. It does hurt a lot while he is sitting. No night time pain. Sore spot is right on bone when he sits. Running 4 days a week without limp or major problems  Exercises currently doing:  Doing extender, step forward and step back with weights (reverse and forward lunge) is doing standing curls with ankle weight. Is doing kick ups when laying down. Is doing diver.   Tried putting nitroglycerin over it, but hasn't noticed any significant change. Did not notice any side effects. Was on 1/4 patch    Physical Exam:  BP 125/82  Ht 5\' 8"  (1.727 m)  Wt 160 lb (72.576 kg)  BMI 24.33 kg/m2  NAD   Demonstrates all exercises without pain  Right side: good hip flexion approximately 100 degrees Left side: hip flexion approximately 90 degrees  Strength of RT HS > LT slightly greater   FABER test on left is tighter than right  Ultrasound The central tear in the hamstring tendon has resolved There is less edema in the biceps femoris There is less hypoechoic change around the attachment hamstring and hip rotators  Assessment/Plan:  Improved HS injury  Next step including more running drills Increase nitroglycerin from 1/4 to 1/2 patch  - Follow-up visit in 6 wks prn  [place]azil[place]Jordan, Katherine, MD  11/26/2013  Pasty ArchEdited KB Fields, MD

## 2014-01-16 ENCOUNTER — Emergency Department (HOSPITAL_COMMUNITY)
Admission: EM | Admit: 2014-01-16 | Discharge: 2014-01-16 | Disposition: A | Discharge status: Home / Self Care | Payer: BC Managed Care – PPO | Source: Home / Self Care | Attending: Emergency Medicine | Admitting: Emergency Medicine

## 2014-01-16 ENCOUNTER — Encounter (HOSPITAL_COMMUNITY): Payer: Self-pay | Admitting: Emergency Medicine

## 2014-01-16 DIAGNOSIS — H01003 Unspecified blepharitis right eye, unspecified eyelid: Secondary | ICD-10-CM

## 2014-01-16 DIAGNOSIS — H01009 Unspecified blepharitis unspecified eye, unspecified eyelid: Secondary | ICD-10-CM

## 2014-01-16 MED ORDER — AMOXICILLIN-POT CLAVULANATE 875-125 MG PO TABS
1.0000 | ORAL_TABLET | Freq: Two times a day (BID) | ORAL | Status: DC
Start: 2014-01-16 — End: 2014-09-11

## 2014-01-16 MED ORDER — POLYMYXIN B-TRIMETHOPRIM 10000-0.1 UNIT/ML-% OP SOLN: 1.0000 [drp] | OPHTHALMIC | Status: DC

## 2014-01-16 MED ORDER — TETRACAINE HCL 0.5 % OP SOLN
OPHTHALMIC | Status: AC
  Filled 2014-01-16: qty 2

## 2014-01-16 MED ORDER — ERYTHROMYCIN 5 MG/GM OP OINT: TOPICAL_OINTMENT | OPHTHALMIC | Status: DC

## 2014-01-16 NOTE — ED Notes (Signed)
C/O right eye irritation x 2-3 days.  Now waking with "goopy" drainage from right eye.  Today has right periorbital swelling and redness; right eye tender to touch.  Pt removed contact lens from right eye.  Also notes he has been on a business trip and has been without some doses of his HTN med.

## 2014-01-16 NOTE — ED Provider Notes (Signed)
  Chief Complaint   Chief Complaint  Patient presents with  . Eye Pain    History of Present Illness   Cameron White is a [age] year old male who has had a 2 to three-day history of right eyelid pain and swelling. This began in the upper lid and progressed to the lower lid. It's tender to touch. He's had minimal discharge from the eye. No redness or pain in the eyeball itself, and no changes in his vision. He wears contact lenses. No photophobia. No trauma or eye injury. No foreign body. He denies any fever, headache, nasal congestion, rhinorrhea, sore throat, or adenopathy.  Review of Systems   Other than as noted above, the patient denies any of the following symptoms: Systemic:  No fever, chills, or headache. Eye:  No blurred vision, or diplopia. ENT:  No nasal congestion, rhinorrhea, or sore throat. Lymphatic:  No adenopathy. Skin:  No rash or pruritis.  PMFSH   Past medical history, family history, social history, meds, and allergies were reviewed.    Physical Examination    Vital signs:  BP 143/93  Pulse 61  Temp(Src) 98.6 F (37 C) (Oral)  SpO2 96%  Visual Acuity:  Right Eye Distance: 20/30 with correction (glasses) Left Eye Distance: 20/25 with correction (contact lens) Bilateral Distance: 20/25 with left eye correction (contact lens)  General:  Alert and in no distress. Eye:  There is mild swelling of the upper lid the lid margin and minimal swelling of the lower lid. There is no conjunctival injection. No discharge. No foreign body. Cornea is intact to fluorescein staining. Anterior chambers normal. PERRLA, full EOMs, fundi benign. ENT:  TMs and canals clear.  Nasal mucosa normal.  No intra-oral lesions, mucous membranes moist, pharynx clear. Neck:  No adenopathy tenderness or mass. Skin:  Clear, warm and dry.  Assessment   The encounter diagnosis was Blepharitis, right.  No evidence of orbital cellulitis.  Plan     1.  Meds:  The following meds were  prescribed:   Discharge Medication List as of 01/16/2014  3:56 PM    START taking these medications   Details  amoxicillin-clavulanate (AUGMENTIN) 875-125 MG per tablet Take 1 tablet by mouth 2 (two) times daily., Starting 01/16/2014, Until Discontinued, Normal    erythromycin ophthalmic ointment Apply to right upper and lower lids TID, Normal    trimethoprim-polymyxin b (POLYTRIM) ophthalmic solution Place 1 drop into the right eye every 4 (four) hours., Starting 01/16/2014, Until Discontinued, Normal        2.  Patient Education/Counseling:  The patient was given appropriate handouts, self care instructions, and instructed in symptomatic relief.    3.  Follow up:  The patient was told to follow up here if no better in 3 to 4 days, or sooner if becoming worse in any way, and given some red flag symptoms such as increasing pain or changes in vision which would prompt immediate return.  Follow up here as needed.      Reuben Likesavid C Bexlee Bergdoll, MD 01/16/14 [phone]

## 2014-01-16 NOTE — Discharge Instructions (Signed)
Blepharitis Blepharitis is redness, soreness, and swelling (inflammation) of one or both eyelids. It may be caused by an allergic reaction or a bacterial infection. Blepharitis may also be associated with reddened, scaly skin (seborrhea) of the scalp and eyebrows. While you sleep, eye discharge may cause your eyelashes to stick together. Your eyelids may itch, burn, swell, and may lose their lashes. These will grow back. Your eyes may become sensitive. Blepharitis may recur and need repeated treatment. If this is the case, you may require further evaluation by an eye specialist (ophthalmologist). HOME CARE INSTRUCTIONS   Keep your hands clean.  Use a clean towel each time you dry your eyelids. Do not use this towel to clean other areas. Do not share a towel or makeup with anyone.  Wash your eyelids with warm water or warm water mixed with a small amount of baby shampoo. Do this twice a day or as often as needed.  Wash your face and eyebrows at least once a day.  Use warm compresses 2 times a day for 10 minutes at a time, or as directed by your caregiver.  Apply antibiotic ointment as directed by your caregiver.  Avoid rubbing your eyes.  Avoid wearing makeup until you get better.  Follow up with your caregiver as directed. SEEK IMMEDIATE MEDICAL CARE IF:   You have pain, redness, or swelling that gets worse or spreads to other parts of your face.  Your vision changes, or you have pain when looking at lights or moving objects.  You have a fever.  Your symptoms continue for longer than 2 to 4 days or become worse. MAKE SURE YOU:   Understand these instructions.  Will watch your condition.  Will get help right away if you are not doing well or get worse. Document Released: 05/25/2000 Document Revised: 08/20/2011 Document Reviewed: 07/05/2010 [employer] Patient Information 2015 [employer]. This information is not intended to replace advice given to you by your health care  provider. Make sure you discuss any questions you have with your health care provider.  

## 2014-05-12 ENCOUNTER — Encounter: Payer: Self-pay | Admitting: Family

## 2014-05-12 ENCOUNTER — Ambulatory Visit (INDEPENDENT_AMBULATORY_CARE_PROVIDER_SITE_OTHER): Payer: BC Managed Care – PPO | Admitting: Family

## 2014-05-12 VITALS — BP 128/88 | HR 47 | Temp 97.7°F | Resp 18 | Ht 68.0 in | Wt 169.0 lb

## 2014-05-12 DIAGNOSIS — R059 Cough, unspecified: Secondary | ICD-10-CM

## 2014-05-12 DIAGNOSIS — R05 Cough: Secondary | ICD-10-CM

## 2014-05-12 MED ORDER — BUDESONIDE-FORMOTEROL FUMARATE 160-4.5 MCG/ACT IN AERO: 2.0000 | INHALATION_SPRAY | Freq: Two times a day (BID) | RESPIRATORY_TRACT | Status: DC

## 2014-05-12 MED ORDER — OMEPRAZOLE 20 MG PO CPDR: 20.0000 mg | DELAYED_RELEASE_CAPSULE | Freq: Every day | ORAL | Status: DC

## 2014-05-12 NOTE — Progress Notes (Signed)
Pre visit review using our clinic review tool, if applicable. No additional management support is needed unless otherwise documented below in the visit note. 

## 2014-05-12 NOTE — Patient Instructions (Signed)
Thank you for choosing [facility].  Summary/Instructions:  Your prescription(s) have been submitted to your pharmacy. Please take as directed and contact our office if you believe you are having problem(s) with the medication(s).  If your symptoms worsen or fail to improve, please contact our office for further instruction, or in case of emergency go directly to the emergency room at the closest medical facility.   It appears her cough is related to either GERD or asthma. We have increased her Symbicort inhaler to your pharmacy with the new dose. We have also sent a prescription for omeprazole. If the cough goes away he can scale down on the omeprazole to see if it is asthma.  Gastroesophageal Reflux Disease, Adult Gastroesophageal reflux disease (GERD) happens when acid from your stomach flows up into the esophagus. When acid comes in contact with the esophagus, the acid causes soreness (inflammation) in the esophagus. Over time, GERD may create small holes (ulcers) in the lining of the esophagus. CAUSES   Increased body weight. This puts pressure on the stomach, making acid rise from the stomach into the esophagus.  Smoking. This increases acid production in the stomach.  Drinking alcohol. This causes decreased pressure in the lower esophageal sphincter (valve or ring of muscle between the esophagus and stomach), allowing acid from the stomach into the esophagus.  Late evening meals and a full stomach. This increases pressure and acid production in the stomach.  A malformed lower esophageal sphincter. Sometimes, no cause is found. SYMPTOMS   Burning pain in the lower part of the mid-chest behind the breastbone and in the mid-stomach area. This may occur twice a week or more often.  Trouble swallowing.  Sore throat.  Dry cough.  Asthma-like symptoms including chest tightness, shortness of breath, or wheezing. DIAGNOSIS  Your caregiver may be able to diagnose GERD based on  your symptoms. In some cases, X-rays and other tests may be done to check for complications or to check the condition of your stomach and esophagus. TREATMENT  Your caregiver may recommend over-the-counter or prescription medicines to help decrease acid production. Ask your caregiver before starting or adding any new medicines.  HOME CARE INSTRUCTIONS   Change the factors that you can control. Ask your caregiver for guidance concerning weight loss, quitting smoking, and alcohol consumption.  Avoid foods and drinks that make your symptoms worse, such as:  Caffeine or alcoholic drinks.  Chocolate.  Peppermint or mint flavorings.  Garlic and onions.  Spicy foods.  Citrus fruits, such as oranges, lemons, or limes.  Tomato-based foods such as sauce, chili, salsa, and pizza.  Fried and fatty foods.  Avoid lying down for the 3 hours prior to your bedtime or prior to taking a nap.  Eat small, frequent meals instead of large meals.  Wear loose-fitting clothing. Do not wear anything tight around your waist that causes pressure on your stomach.  Raise the head of your bed 6 to 8 inches with wood blocks to help you sleep. Extra pillows will not help.  Only take over-the-counter or prescription medicines for pain, discomfort, or fever as directed by your caregiver.  Do not take aspirin, ibuprofen, or other nonsteroidal anti-inflammatory drugs (NSAIDs). SEEK IMMEDIATE MEDICAL CARE IF:   You have pain in your arms, neck, jaw, teeth, or back.  Your pain increases or changes in intensity or duration.  You develop nausea, vomiting, or sweating (diaphoresis).  You develop shortness of breath, or you faint.  Your vomit is green, yellow, black,  or looks like coffee grounds or blood.  Your stool is red, bloody, or black. These symptoms could be signs of other problems, such as heart disease, gastric bleeding, or esophageal bleeding. MAKE SURE YOU:   Understand these  instructions.  Will watch your condition.  Will get help right away if you are not doing well or get worse. Document Released: 03/07/2005 Document Revised: 08/20/2011 Document Reviewed: 12/15/2010 [facility] Patient Information 2015 [employer] [place]. This information is not intended to replace advice given to you by your health care provider. Make sure you discuss any questions you have with your health care provider.

## 2014-05-12 NOTE — Progress Notes (Signed)
   Subjective:    Patient ID: Cameron White, male    DOB: [birth year], 43 y.o.   MRN: [id number]  Chief Complaint  Patient presents with  . Cough    x3 or 4 weeks, thinks it might be acid reflux related, omprezole did help    HPI:  Cameron White is a 43 y.o. male who presents today for an acute visit for a cough.  Acute symptoms started about 3 weeks ago and believes it may be related to acid reflux. Denies any productive cough. Has taken omeprazole in the past which has helped. Denies coughing worse at night and typically worse at night, but not currently experiencing. Denies fevers or other cold/flu like symptoms.   No Known Allergies  Current Outpatient Prescriptions on File Prior to Visit  Medication Sig Dispense Refill  . albuterol (PROVENTIL HFA) 108 (90 BASE) MCG/ACT inhaler Inhale 2 puffs into the lungs every 4 (four) hours as needed. 1 Inhaler 5  . erythromycin ophthalmic ointment Apply to right upper and lower lids TID 1 g 0  . losartan (COZAAR) 50 MG tablet TAKE 1 TABLET BY MOUTH DAILY 30 tablet 10  . nitroGLYCERIN (NITRODUR - DOSED IN MG/24 HR) 0.2 mg/hr patch Place 1 patch (0.2 mg total) onto the skin daily. You will use 1/4 patch daily to patellar tendon RT and to Achilles tendon left Replace daily 30 patch 1  . trimethoprim-polymyxin b (POLYTRIM) ophthalmic solution Place 1 drop into the right eye every 4 (four) hours. 10 mL 0  . amoxicillin-clavulanate (AUGMENTIN) 875-125 MG per tablet Take 1 tablet by mouth 2 (two) times daily. (Patient not taking: Reported on 05/12/2014) 20 tablet 0   No current facility-administered medications on file prior to visit.    Review of Systems  See HPI    Objective:    BP 128/88 mmHg  Pulse 47  Temp(Src) 97.7 F (36.5 C) (Oral)  Resp 18  Ht 5\' 8"  (1.727 m)  Wt 169 lb (76.658 kg)  BMI 25.70 kg/m2  SpO2 95% Nursing note and vital signs reviewed.  Physical Exam  Constitutional: He is oriented to person, place, and time.  He appears well-developed and well-nourished. No distress.  Cardiovascular: Normal rate, regular rhythm, normal heart sounds and intact distal pulses.   Pulmonary/Chest: Effort normal and breath sounds normal.  Neurological: He is alert and oriented to person, place, and time.  Skin: Skin is warm and dry.  Psychiatric: He has a normal mood and affect. His behavior is normal. Judgment and thought content normal.       Assessment & Plan:

## 2014-05-12 NOTE — Assessment & Plan Note (Signed)
Symptoms and exam consistent with cough associated with either GERD or asthma. Will increase Symbicort. Trial of omeprazole. If symptoms improve may trial without omeprazole. Follow up if symptoms worsen or fail to improve.

## 2014-07-30 ENCOUNTER — Other Ambulatory Visit: Payer: Self-pay | Admitting: Internal Medicine

## 2014-09-11 ENCOUNTER — Encounter: Payer: Self-pay | Admitting: Family Medicine

## 2014-09-11 ENCOUNTER — Ambulatory Visit (INDEPENDENT_AMBULATORY_CARE_PROVIDER_SITE_OTHER): Payer: BC Managed Care – PPO | Admitting: Family Medicine

## 2014-09-11 VITALS — BP 120/80 | HR 51 | Temp 98.2°F | Ht 68.0 in | Wt 170.0 lb

## 2014-09-11 DIAGNOSIS — J302 Other seasonal allergic rhinitis: Secondary | ICD-10-CM | POA: Diagnosis not present

## 2014-09-11 NOTE — Progress Notes (Signed)
Subjective:    Patient ID: Cameron White, male    DOB: 1970/08/12, 44 y.o.   MRN: [id number]  HPI  Patient here for cough and sore throat for 2-3 days. No fevers, chills,  Dry hacky cough.    Past Medical History  Diagnosis Date  . ASTHMA   . GERD   . Achilles tendinitis on left   . Patellar tendinitis   . HALLUX RIGIDUS, ACQUIRED   . SACROILIAC JOINT DYSFUNCTION   . Hypertension     Review of Systems  Constitutional: Negative for fever, chills, fatigue and unexpected weight change.  HENT: Positive for postnasal drip and sneezing. Negative for congestion, ear pain, facial swelling, rhinorrhea, sinus pressure, sore throat and trouble swallowing.   Respiratory: Positive for cough. Negative for shortness of breath and wheezing.   Cardiovascular: Negative for chest pain and palpitations.    Current Outpatient Prescriptions on File Prior to Visit  Medication Sig Dispense Refill  . albuterol (PROVENTIL HFA) 108 (90 BASE) MCG/ACT inhaler Inhale 2 puffs into the lungs every 4 (four) hours as needed. 1 Inhaler 5  . budesonide-formoterol (SYMBICORT) 160-4.5 MCG/ACT inhaler Inhale 2 puffs into the lungs 2 (two) times daily. 1 Inhaler 3  . losartan (COZAAR) 50 MG tablet Take 1 tablet (50 mg total) by mouth daily. 30 tablet 3  . omeprazole (PRILOSEC) 20 MG capsule Take 1 capsule (20 mg total) by mouth daily. 30 capsule 3  . nitroGLYCERIN (NITRODUR - DOSED IN MG/24 HR) 0.2 mg/hr patch Place 1 patch (0.2 mg total) onto the skin daily. You will use 1/4 patch daily to patellar tendon RT and to Achilles tendon left Replace daily (Patient not taking: Reported on 09/11/2014) 30 patch 1   No current facility-administered medications on file prior to visit.       Objective:    Physical Exam  Constitutional: He is oriented to person, place, and time. He appears well-developed and well-nourished. No distress.  HENT:  Head: Normocephalic and atraumatic.  No TTP over sinuses + turbinate  edema + PND TMs normal bilaterally  Eyes: Conjunctivae and EOM are normal. Pupils are equal, round, and reactive to light.  Neck: Normal range of motion. Neck supple.  Cardiovascular: Normal rate, regular rhythm and normal heart sounds.   Pulmonary/Chest: Effort normal and breath sounds normal. No respiratory distress. He has no wheezes.  Lymphadenopathy:    He has no cervical adenopathy.  Neurological: He is alert and oriented to person, place, and time.  Skin: Skin is warm and dry.    BP 120/80 mmHg  Pulse 51  Temp(Src) 98.2 F (36.8 C) (Oral)  Ht  (1.727 m)  Wt 170 lb (77.111 kg)  BMI 25.85 kg/m2  SpO2 97% Wt Readings from Last 3 Encounters:  09/11/14 170 lb (77.111 kg)  05/12/14 169 lb (76.658 kg)  11/26/13 160 lb (72.576 kg)     Lab Results  Component Value Date   WBC 5.8 08/03/2013   HGB 16.3 08/03/2013   HCT 47.4 08/03/2013   PLT 166.0 08/03/2013   GLUCOSE 93 08/03/2013   CHOL 174 08/03/2013   TRIG 49.0 08/03/2013   HDL 62.40 08/03/2013   LDLCALC 102* 08/03/2013   ALT 53 08/03/2013   AST 40* 08/03/2013   NA 139 08/03/2013   K 4.3 08/03/2013   CL 105 08/03/2013   CREATININE 0.7 08/03/2013   BUN 16 08/03/2013   CO2 30 08/03/2013   TSH 1.23 08/03/2013   PSA 0.91 08/03/2013  Assessment & Plan:   Problem List Items Addressed This Visit    Seasonal allergies - Primary    Take otc antihistamine con't reg meds Call or rto if symptoms worsen         I have discontinued [name]'s amoxicillin-clavulanate, erythromycin, and trimethoprim-polymyxin b. I am also having him maintain his albuterol, nitroGLYCERIN, budesonide-formoterol, omeprazole, and losartan.  No orders of the defined types were placed in this encounter.     Loreen FreudYvonne Lowne, DO

## 2014-09-11 NOTE — Patient Instructions (Signed)

## 2014-09-11 NOTE — Progress Notes (Signed)
Pre visit review using our clinic review tool, if applicable. No additional management support is needed unless otherwise documented below in the visit note. 

## 2014-09-11 NOTE — Assessment & Plan Note (Signed)
Take otc antihistamine con't reg meds Call or rto if symptoms worsen

## 2014-09-17 ENCOUNTER — Telehealth: Payer: Self-pay | Admitting: Internal Medicine

## 2014-09-17 MED ORDER — FLUTICASONE PROPIONATE 50 MCG/ACT NA SUSP: 2.0000 | Freq: Every day | NASAL | Status: DC

## 2014-09-17 NOTE — Telephone Encounter (Signed)
Sent in flonase which should help to do more for his symptoms. If still not doing well advise visit on Monday to be seen. 2 puffs each nostril once daily.

## 2014-09-17 NOTE — Telephone Encounter (Signed)
Patient aware.

## 2014-09-17 NOTE — Telephone Encounter (Signed)
Patient called back and its his cough that is getting worse and feels that flonase is not going to help his cough.

## 2014-09-17 NOTE — Telephone Encounter (Signed)
Please advise, thanks.

## 2014-09-17 NOTE — Telephone Encounter (Signed)
Patient is not feeling any better since his visit last Saturday. He is asking for something to be called in. He did want it noted that he has had success with prednisone in the past. Please contact patient to advise.   Confirmed pharmacy is correct.

## 2014-09-17 NOTE — Telephone Encounter (Signed)
Continuing from below: He has no sinus conjestion at all

## 2014-09-22 ENCOUNTER — Ambulatory Visit (INDEPENDENT_AMBULATORY_CARE_PROVIDER_SITE_OTHER): Payer: BC Managed Care – PPO | Admitting: Sports Medicine

## 2014-09-22 ENCOUNTER — Encounter: Payer: Self-pay | Admitting: Sports Medicine

## 2014-09-22 VITALS — BP 157/90 | HR 44 | Ht 68.0 in | Wt 170.0 lb

## 2014-09-22 DIAGNOSIS — S86112A Strain of other muscle(s) and tendon(s) of posterior muscle group at lower leg level, left leg, initial encounter: Secondary | ICD-10-CM | POA: Diagnosis not present

## 2014-09-22 DIAGNOSIS — S86819A Strain of other muscle(s) and tendon(s) at lower leg level, unspecified leg, initial encounter: Secondary | ICD-10-CM | POA: Insufficient documentation

## 2014-09-22 MED ORDER — NITROGLYCERIN 0.2 MG/HR TD PT24: MEDICATED_PATCH | TRANSDERMAL | Status: DC

## 2014-09-22 MED ORDER — PREDNISONE 10 MG PO TABS: 20.0000 mg | ORAL_TABLET | Freq: Two times a day (BID) | ORAL | Status: DC

## 2014-09-22 MED ORDER — NITROGLYCERIN 0.2 MG/HR TD PT24: 0.2000 mg | MEDICATED_PATCH | Freq: Every day | TRANSDERMAL | Status: DC

## 2014-09-22 NOTE — Assessment & Plan Note (Signed)
-  Begin wearing compression sleeve -Start NTG protocol -Prednisone 20mg  bid x 7 days for unrelated cough complaint -Heel lifts to shoes, which he has at home -Start calf rehab exercise protocol -May practice hitting, play if pain allows, though discussed the low risk of possible further tearing -Plan follow-up in 6-8 weeks for re-evaluation or sooner if needed.

## 2014-09-22 NOTE — Progress Notes (Signed)
   Subjective:    Patient ID: Cameron White, male    DOB: 1971/05/09, 44 y.o.   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  He noticed onset of symptoms approximately 7-10 days ago during a tennis match when he felt some pain developing in the inferior portion of the left calf.  He denies feeling any pop, noticing any swelling, or bruising.  There is a discrete area of localized tenderness to palpation at the inferior left calf if he presses on it.  Symptoms are relieved with rest.  He has not tried anything for this problem.  Past medical history, social history, medications, and allergies were reviewed and are up to date in the chart.  Review of Systems 7 point review of systems was performed and was otherwise negative unless noted in the history of present illness.     Objective:   Physical Exam BP 157/90 mmHg  Pulse 44  Ht 5\' 8"  (1.727 m)  Wt 170 lb (77.111 kg)  BMI 25.85 kg/m2 GEN: The patient is well-developed well-nourished male and in no acute distress.  He is awake alert and oriented x3. SKIN: warm and well-perfused, no rash  EXTR: No lower extremity edema or calf tenderness Neuro: Strength 5/5 globally. Sensation intact throughout. No focal deficits. Vasc: +2 bilateral distal pulses. No edema.  MSK: Examination of left calf reveals mild discrete tenderness to palpation at the inferior lateral border of the soleus.  The Achilles tendon is palpably intact.  Full plantar strength, though some reproduction of pain.  No localized bruising or swelling.  No skin changes.  Limited musculoskeletal ultrasound: Long and short axis views are obtained of the left calf.  There is an area of hypoechoic change located at the lateral border of the left soleus and inferior to the lateral gastroc intersection.  No hematoma seen.  The Achilles tendon appears intact and normal caliber without tears.    Assessment & Plan:  Please see problem  based assessment and plan in the problem list.

## 2014-10-05 ENCOUNTER — Ambulatory Visit (INDEPENDENT_AMBULATORY_CARE_PROVIDER_SITE_OTHER)
Admission: RE | Admit: 2014-10-05 | Discharge: 2014-10-05 | Disposition: A | Discharge status: Home / Self Care | Payer: BC Managed Care – PPO | Source: Ambulatory Visit | Attending: Internal Medicine | Admitting: Internal Medicine

## 2014-10-05 ENCOUNTER — Encounter: Payer: Self-pay | Admitting: Internal Medicine

## 2014-10-05 ENCOUNTER — Ambulatory Visit (INDEPENDENT_AMBULATORY_CARE_PROVIDER_SITE_OTHER): Payer: BC Managed Care – PPO | Admitting: Internal Medicine

## 2014-10-05 VITALS — BP 150/100 | HR 59 | Temp 98.3°F | Ht 68.0 in | Wt 171.5 lb

## 2014-10-05 DIAGNOSIS — R059 Cough, unspecified: Secondary | ICD-10-CM

## 2014-10-05 DIAGNOSIS — J310 Chronic rhinitis: Secondary | ICD-10-CM | POA: Diagnosis not present

## 2014-10-05 DIAGNOSIS — K219 Gastro-esophageal reflux disease without esophagitis: Secondary | ICD-10-CM

## 2014-10-05 DIAGNOSIS — I10 Essential (primary) hypertension: Secondary | ICD-10-CM | POA: Diagnosis not present

## 2014-10-05 DIAGNOSIS — R05 Cough: Secondary | ICD-10-CM

## 2014-10-05 MED ORDER — BUDESONIDE-FORMOTEROL FUMARATE 160-4.5 MCG/ACT IN AERO: 2.0000 | INHALATION_SPRAY | Freq: Two times a day (BID) | RESPIRATORY_TRACT | Status: DC

## 2014-10-05 MED ORDER — ALBUTEROL SULFATE HFA 108 (90 BASE) MCG/ACT IN AERS: 2.0000 | INHALATION_SPRAY | RESPIRATORY_TRACT | Status: DC | PRN

## 2014-10-05 NOTE — Progress Notes (Signed)
   Subjective:    Patient ID: Cameron White, male    DOB: [birth year], 44 y.o.   MRN: [id number]  HPI He has had a cough for 2 months. There is no specific trigger. He has a history of "runners cough "seasonally. He's been using Symbicort 2 puffs twice a day; loratadine; Flonase; and took a seven-day course of prednisone from a sports medicine specialist with minimal benefit in his cough. The cough can be intermittently productive of yellow mucus or dry. It is not awakening him. He does have some postnasal drainage and sore throat which has been present for 3 days. He has no upper respiratory tract infection symptoms except for minor fatigue. In December 2015 he was given omeprazole for reflux; his cough improved at that time.  He does not smoke. He does not ingest aspirin or peppermint. He has a cola every other day. He will have alcohol every other day. He has 1-1.5 cups of coffee a day.   Review of Systems Frontal headache, facial pain , nasal purulence, dental pain, sore throat , otic pain or otic discharge denied. No fever , chills or sweats. Unexplained weight loss, abdominal pain, significant dyspepsia, dysphagia, melena, rectal bleeding, or persistently small caliber stools are denied.    Objective:   Physical Exam General appearance:Adequately nourished; no acute distress or increased work of breathing is present.   Pattern alopecia.  Lymphatic: No  lymphadenopathy about the head, neck, or axilla .  Eyes: No conjunctival inflammation or lid edema is present. There is no scleral icterus.  Ears:  External ear exam shows no significant lesions or deformities.  Otoscopic examination reveals clear canals, tympanic membranes are intact bilaterally without bulging, retraction, inflammation or discharge.  Nose:  External nasal examination shows no deformity or inflammation. Nasal mucosa are erythematous without lesions or exudates No septal dislocation or deviation.No obstruction to  airflow.   Oral exam: Dental hygiene is good; lips and gums are healthy appearing.There is no oropharyngeal erythema or exudate .  Neck:  No deformities, thyromegaly, masses, or tenderness noted.   Supple with full range of motion without pain.   Heart:  Normal rate and regular rhythm. S1 and S2 normal without gallop, murmur, click, rub or other extra sounds.   Lungs:Chest clear to auscultation; no wheezes, rhonchi,rales ,or rubs present.  Asymmetry of the posterior thorax; the right paraspinal thoracic muscles are larger than the left.  Extremities:  No cyanosis, edema, or clubbing  noted    Skin: Warm & dry w/o tenting or jaundice. No significant lesions or rash.        Assessment & Plan:  #1 cough with minimal response to a seven-day course of prednisone and Symbicort. History of exercise-induced bronchospasm  #2 History of reflux  #3 hypertension  Plan: See orders and recommendations

## 2014-10-05 NOTE — Progress Notes (Signed)
Pre visit review using our clinic review tool, if applicable. No additional management support is needed unless otherwise documented below in the visit note. 

## 2014-10-05 NOTE — Patient Instructions (Signed)
Reflux of gastric acid may be asymptomatic as this may occur mainly during sleep.The triggers for reflux  include stress; the "aspirin family" ; alcohol; peppermint; and caffeine (coffee, tea, cola, and chocolate). The aspirin family would include aspirin and the nonsteroidal agents such as ibuprofen &  Naproxen. Tylenol would not cause reflux. If having symptoms ; food & drink should be avoided for @ least 2 hours before going to bed.  Take the protein pump inhibitor ,Omeprazole 30 minutes before breakfast and 30 minutes before the evening meal for 8 weeks then go back to once a day  30 minutes before breakfast.  Plain Mucinex (NOT D) for thick secretions ;force NON dairy fluids .   Nasal cleansing in the shower as discussed with lather of mild shampoo.After 10 seconds wash off lather while  exhaling through nostrils. Make sure that all residual soap is removed to prevent irritation.  Flonase OR Nasacort AQ 1 spray in each nostril twice a day as needed. Use the "crossover" technique into opposite nostril spraying toward opposite ear @ 45 degree angle, not straight up into nostril.  Plain Allegra (NOT D )  160 daily , Loratidine 10 mg , OR Zyrtec 10 mg @ bedtime  as needed for itchy eyes & sneezing.  Minimal Blood Pressure Goal= AVERAGE < 140/90;  Ideal is an AVERAGE < 135/85. This AVERAGE should be calculated from @ least 5-7 BP readings taken @ different times of day on different days of week. You should not respond to isolated BP readings , but rather the AVERAGE for that week .Please bring your  blood pressure cuff to office visits to verify that it is reliable.It  can also be checked against the blood pressure device at the pharmacy. Finger or wrist cuffs are not dependable; an arm cuff is.If BP averages > 140/90 ; increase Losartan to 50 mg two pills daily.

## 2014-11-16 ENCOUNTER — Encounter: Payer: Self-pay | Admitting: Sports Medicine

## 2014-11-16 ENCOUNTER — Ambulatory Visit (INDEPENDENT_AMBULATORY_CARE_PROVIDER_SITE_OTHER): Payer: BC Managed Care – PPO | Admitting: Sports Medicine

## 2014-11-16 VITALS — BP 124/81

## 2014-11-16 DIAGNOSIS — S76202A Unspecified injury of adductor muscle, fascia and tendon of left thigh, initial encounter: Secondary | ICD-10-CM

## 2014-11-17 ENCOUNTER — Encounter: Payer: Self-pay | Admitting: Sports Medicine

## 2014-11-17 DIAGNOSIS — S76202A Unspecified injury of adductor muscle, fascia and tendon of left thigh, initial encounter: Secondary | ICD-10-CM | POA: Insufficient documentation

## 2014-11-17 MED ORDER — NITROGLYCERIN 0.2 MG/HR TD PT24: MEDICATED_PATCH | TRANSDERMAL | Status: DC

## 2014-11-17 NOTE — Progress Notes (Signed)
  Cameron FerrariVincent I White - 44 y.o. male MRN [id number]  Date of birth: 03-17-1971  SUBJECTIVE:  Including CC & ROS.  Patient is a pleasant 44 yo male presenting for a new complain of left groin strain with pain for 1 month. He reports while playing tennis he pushed off his left foot while cutting right and felt a pop in his groin. He cut back on running and tennis for 3 weeks but then this same MOI of occurred again this weekend and he felt another pop in the left groin. Describes the pain as a dull soreness. He denies any difficulty with walking, unable to run or cut, no catching or snapping sensation, no swelling or bruising present. He has been treating with relative rest and icing.  He was recently seen in April for a calf strain which improve and resolved with compression, HEP, and NTG protocol.    ROS: Review of systems otherwise negative except for information present in HPI  HISTORY: Past Medical, Surgical, Social, and Family History Reviewed & Updated per EMR. Pertinent Historical Findings include: Asthma, HTN, GERD, HLD  DATA REVIEWED: No recent imaging  PHYSICAL EXAM:  VS: BP:124/81 mmHg  HR: bpm  TEMP: ( )  RESP:   HT:    WT:   BMI:  HIP EXAM:  General: well nourished Skin of LE: warm; dry, no rashes, lesions, ecchymosis or erythema. Vascular: Dorsal pedal and femoral pulses 2+ bilaterally Neurologically: Sensation to light touch lower extremities equal and intact bilaterally.    Observation - no ecchymosis, edema, or hematoma present over the anterior hip Palpation: mild to moderate TTP over the left deep groin at the adductor muscle group origin to the pubic ramus  ROM: Normal Hip motion with some pain with ER Muscle strength: mild pain but no weakness in hip flexion, increase pain 4/5 weakness in hip flex/ER, increase pain 3/5 weakness with hip adduction.   MSK US: Fascial disruption at the origin of the adductor longus attachment to the pubic ramus seen on transverse view,  longitudinal view reveals stump sign with partial adductor longus tear and mild retraction.   ASSESSMENT & PLAN: See problem based charting & AVS for pt instructions. Impression: Left adductor longus strain at origin   Recommendations: -Modify activity by decreasing cutting in tennis or rest from tennis for 1-2 weeks, no running until pain resolved.  -Start compression sleeve which patient has at home -Start using NTG protocol over the adductor starting with 1/4 patch then increase to 1/2 patch if tolerate headaches -HEP provided to work on strengthening with 4 plane SLR, Clamshells, bridge w/ ball squeeze.  -Follow up in 4-6 week to re-evaluate with [place]

## 2014-12-20 ENCOUNTER — Encounter: Payer: Self-pay | Admitting: Internal Medicine

## 2014-12-20 ENCOUNTER — Other Ambulatory Visit: Payer: Self-pay | Admitting: Internal Medicine

## 2014-12-22 ENCOUNTER — Ambulatory Visit: Payer: BC Managed Care – PPO | Admitting: Sports Medicine

## 2014-12-30 ENCOUNTER — Encounter: Payer: Self-pay | Admitting: Sports Medicine

## 2014-12-30 ENCOUNTER — Ambulatory Visit (INDEPENDENT_AMBULATORY_CARE_PROVIDER_SITE_OTHER): Payer: BC Managed Care – PPO | Admitting: Sports Medicine

## 2014-12-30 VITALS — BP 128/85 | HR 57 | Ht 68.0 in | Wt 171.0 lb

## 2014-12-30 DIAGNOSIS — S76202D Unspecified injury of adductor muscle, fascia and tendon of left thigh, subsequent encounter: Secondary | ICD-10-CM | POA: Diagnosis not present

## 2014-12-30 DIAGNOSIS — M7651 Patellar tendinitis, right knee: Secondary | ICD-10-CM | POA: Diagnosis not present

## 2014-12-30 NOTE — Assessment & Plan Note (Signed)
RT knee with distal patellar spur/ bone cyst that impinges on pat tendon  Try pat strap  NTG seems to help pain

## 2014-12-30 NOTE — Assessment & Plan Note (Signed)
Tear is 90% healed on U/S. - Continue hip strengthening exercises (4 plane SLR, clamshells, bridge with ball squeeze). - Activity as tolerated; slowly work your way up to full cutting in tennis  - Use compression sleeve with tennis for the next 6 weeks - Follow up as needed

## 2014-12-30 NOTE — Progress Notes (Signed)
Subjective:     Patient ID: Cameron White, male   DOB: November 26, 1970, 44 y.o.   MRN: [id number]  HPI Patient is a 44 yo male here for 6 week follow up for left adductor strain. He is doing better. He is doing his hip strengthening exercises daily. He has mild pain with single leg raises and adductor exercises. He is able to run with minimal pain. He has been playing tennis some but has not been cutting or very active on the court. He has uses his compression sleeve when he plays tennis. He has tried nitroglycerin patches but they do not stay on his skin.   Chronic RT distal patellar pain and a knot that has been there for a long time.  Review of Systems     Objective:   Physical Exam Gen: NAD BP 128/85 mmHg  Pulse 57  Ht  (1.727 m)  Wt 171 lb (77.565 kg)  BMI 26.01 kg/m2   MSK: Left leg: No swelling, erythema, ecchymosis noted. No tenderness to palpation over groin. Normal hip ROM.  Good strength with hip adduction, abduction, flexion, extension. Lower extremity is neurovascularly intact.  Right knee: Bony prominence noted on anterior portion of knee. Mild tenderness to palpation over bony prominence.  U/S:  Adductor longus tendon intact. Calcium deposits noted at adductor longus attachment to pubis ramus where tear was seen in prior U/S. Minimal swelling noted. Tear is over 90% healed.  Right knee: Bone cyst on distal patellar pole. Spurring also seen on right tibial tubercle.    Assessment:     See problem focused A/P     Plan:     See problem focused A/P

## 2015-01-06 ENCOUNTER — Ambulatory Visit (INDEPENDENT_AMBULATORY_CARE_PROVIDER_SITE_OTHER): Payer: BC Managed Care – PPO | Admitting: Internal Medicine

## 2015-01-06 ENCOUNTER — Encounter: Payer: Self-pay | Admitting: Internal Medicine

## 2015-01-06 VITALS — BP 124/74 | HR 59 | Temp 98.1°F | Resp 16 | Ht 68.0 in | Wt 170.4 lb

## 2015-01-06 DIAGNOSIS — Z0000 Encounter for general adult medical examination without abnormal findings: Secondary | ICD-10-CM

## 2015-01-06 DIAGNOSIS — I10 Essential (primary) hypertension: Secondary | ICD-10-CM

## 2015-01-06 MED ORDER — TYPHOID VACCINE PO CPDR: 1.0000 | DELAYED_RELEASE_CAPSULE | ORAL | Status: DC

## 2015-01-06 NOTE — Progress Notes (Signed)
Pre visit review using our clinic review tool, if applicable. No additional management support is needed unless otherwise documented below in the visit note. 

## 2015-01-06 NOTE — Patient Instructions (Signed)
We have given you the prescription for the typhoid vaccine which is a pill. Take 1 pill on Day 1, 3, 5, and 7. You must finish the pills at least 2 weeks before leaving for [place].   Come back at least 1 month before your trip to get the hepatitis A shot.   We do not need blood work today.  Health Maintenance A healthy lifestyle and preventative care can promote health and wellness.  Maintain regular health, dental, and eye exams.  Eat a healthy diet. Foods like vegetables, fruits, whole grains, low-fat dairy products, and lean protein foods contain the nutrients you need and are low in calories. Decrease your intake of foods high in solid fats, added sugars, and salt. Get information about a proper diet from your health care provider, if necessary.  Regular physical exercise is one of the most important things you can do for your health. Most adults should get at least 150 minutes of moderate-intensity exercise (any activity that increases your heart rate and causes you to sweat) each week. In addition, most adults need muscle-strengthening exercises on 2 or more days a week.   Maintain a healthy weight. The body mass index (BMI) is a screening tool to identify possible weight problems. It provides an estimate of body fat based on height and weight. Your health care provider can find your BMI and can help you achieve or maintain a healthy weight. For males 20 years and older:  A BMI below 18.5 is considered underweight.  A BMI of 18.5 to 24.9 is normal.  A BMI of 25 to 29.9 is considered overweight.  A BMI of 30 and above is considered obese.  Maintain normal blood lipids and cholesterol by exercising and minimizing your intake of saturated fat. Eat a balanced diet with plenty of fruits and vegetables. Blood tests for lipids and cholesterol should begin at age 94 and be repeated every 5 years. If your lipid or cholesterol levels are high, you are over age 10, or you are at high risk  for heart disease, you may need your cholesterol levels checked more frequently.Ongoing high lipid and cholesterol levels should be treated with medicines if diet and exercise are not working.  If you smoke, find out from your health care provider how to quit. If you do not use tobacco, do not start.  Lung cancer screening is recommended for adults aged 55-80 years who are at high risk for developing lung cancer because of a history of smoking. A yearly low-dose CT scan of the lungs is recommended for people who have at least a 30-pack-year history of smoking and are current smokers or have quit within the past 15 years. A pack year of smoking is smoking an average of 1 pack of cigarettes a day for 1 year (for example, a 30-pack-year history of smoking could mean smoking 1 pack a day for 30 years or 2 packs a day for 15 years). Yearly screening should continue until the smoker has stopped smoking for at least 15 years. Yearly screening should be stopped for people who develop a health problem that would prevent them from having lung cancer treatment.  If you choose to drink alcohol, do not have more than 2 drinks per day. One drink is considered to be 12 oz (360 mL) of beer, 5 oz (150 mL) of wine, or 1.5 oz (45 mL) of liquor.  Avoid the use of street drugs. Do not share needles with anyone. Ask for help  if you need support or instructions about stopping the use of drugs.  High blood pressure causes heart disease and increases the risk of stroke. Blood pressure should be checked at least every 1-2 years. Ongoing high blood pressure should be treated with medicines if weight loss and exercise are not effective.  If you are 26-93 years old, ask your health care provider if you should take aspirin to prevent heart disease.  Diabetes screening involves taking a blood sample to check your fasting blood sugar level. This should be done once every 3 years after age 67 if you are at a normal weight and without  risk factors for diabetes. Testing should be considered at a younger age or be carried out more frequently if you are overweight and have at least 1 risk factor for diabetes.  Colorectal cancer can be detected and often prevented. Most routine colorectal cancer screening begins at the age of 46 and continues through age 72. However, your health care provider may recommend screening at an earlier age if you have risk factors for colon cancer. On a yearly basis, your health care provider may provide home test kits to check for hidden blood in the stool. A small camera at the end of a tube may be used to directly examine the colon (sigmoidoscopy or colonoscopy) to detect the earliest forms of colorectal cancer. Talk to your health care provider about this at age 5 when routine screening begins. A direct exam of the colon should be repeated every 5-10 years through age 24, unless early forms of precancerous polyps or small growths are found.  People who are at an increased risk for hepatitis B should be screened for this virus. You are considered at high risk for hepatitis B if:  You were born in a country where hepatitis B occurs often. Talk with your health care provider about which countries are considered high risk.  Your parents were born in a high-risk country and you have not received a shot to protect against hepatitis B (hepatitis B vaccine).  You have HIV or AIDS.  You use needles to inject street drugs.  You live with, or have sex with, someone who has hepatitis B.  You are a man who has sex with other men (MSM).  You get hemodialysis treatment.  You take certain medicines for conditions like cancer, organ transplantation, and autoimmune conditions.  Hepatitis C blood testing is recommended for all people born from [birth year] through 1967 and any individual with known risk factors for hepatitis C.  Healthy men should no longer receive prostate-specific antigen (PSA) blood tests as part of  routine cancer screening. Talk to your health care provider about prostate cancer screening.  Testicular cancer screening is not recommended for adolescents or adult males who have no symptoms. Screening includes self-exam, a health care provider exam, and other screening tests. Consult with your health care provider about any symptoms you have or any concerns you have about testicular cancer.  Practice safe sex. Use condoms and avoid high-risk sexual practices to reduce the spread of sexually transmitted infections (STIs).  You should be screened for STIs, including gonorrhea and chlamydia if:  You are sexually active and are younger than 24 years.  You are older than 24 years, and your health care provider tells you that you are at risk for this type of infection.  Your sexual activity has changed since you were last screened, and you are at an increased risk for chlamydia or gonorrhea. Ask  your health care provider if you are at risk.  If you are at risk of being infected with HIV, it is recommended that you take a prescription medicine daily to prevent HIV infection. This is called pre-exposure prophylaxis (PrEP). You are considered at risk if:  You are a man who has sex with other men (MSM).  You are a heterosexual man who is sexually active with multiple partners.  You take drugs by injection.  You are sexually active with a partner who has HIV.  Talk with your health care provider about whether you are at high risk of being infected with HIV. If you choose to begin PrEP, you should first be tested for HIV. You should then be tested every 3 months for as long as you are taking PrEP.  Use sunscreen. Apply sunscreen liberally and repeatedly throughout the day. You should seek shade when your shadow is shorter than you. Protect yourself by wearing long sleeves, pants, a wide-brimmed hat, and sunglasses year round whenever you are outdoors.  Tell your health care provider of new moles  or changes in moles, especially if there is a change in shape or color. Also, tell your health care provider if a mole is larger than the size of a pencil eraser.  A one-time screening for abdominal aortic aneurysm (AAA) and surgical repair of large AAAs by ultrasound is recommended for men aged 65-75 years who are current or former smokers.  Stay current with your vaccines (immunizations). Document Released: 11/24/2007 Document Revised: 06/02/2013 Document Reviewed: 10/23/2010 [facility] Patient Information 2015 [place], [place]. This information is not intended to replace advice given to you by your health care provider. Make sure you discuss any questions you have with your health care provider.

## 2015-01-07 ENCOUNTER — Encounter: Payer: Self-pay | Admitting: Internal Medicine

## 2015-01-07 DIAGNOSIS — Z0000 Encounter for general adult medical examination without abnormal findings: Secondary | ICD-10-CM | POA: Insufficient documentation

## 2015-01-07 NOTE — Assessment & Plan Note (Signed)
BP controlled on losartan 50 mg daily. Reviewed recent BMP and no indication for change today.

## 2015-01-07 NOTE — Assessment & Plan Note (Signed)
Talked with him about travel safety since he is going to [place] in November. Given rx for typhoid vaccine needs to be taken 2 weeks prior to departure. Also advised that he come back and get hepatitis A vaccine at least 1 month prior to departure. BP at goal, non-smoker. Exercises some. Up to date on immunizations and screening.

## 2015-01-07 NOTE — Progress Notes (Signed)
   Subjective:    Patient ID: Cameron White, male    DOB: 07/16/1970, 44 y.o.   MRN: [id number]  HPI The patient is a 44 YO man coming in for wellness. No new complaints.   PMH, [facility], social history reviewed and updated.   Review of Systems  Constitutional: Negative for fever, activity change, appetite change and fatigue.  HENT: Negative.   Eyes: Negative.   Respiratory: Negative for cough, chest tightness, shortness of breath and wheezing.   Cardiovascular: Negative for chest pain, palpitations and leg swelling.  Gastrointestinal: Negative for nausea, abdominal pain, diarrhea, constipation and abdominal distention.  Musculoskeletal: Negative.   Skin: Negative.   Neurological: Negative.   Psychiatric/Behavioral: Negative.       Objective:   Physical Exam  Constitutional: He is oriented to person, place, and time. He appears well-developed and well-nourished.  HENT:  Head: Normocephalic and atraumatic.  Eyes: EOM are normal.  Neck: Normal range of motion.  Cardiovascular: Normal rate and regular rhythm.   Pulmonary/Chest: Effort normal. No respiratory distress. He has no wheezes. He has no rales.  Abdominal: Soft. Bowel sounds are normal. He exhibits no distension. There is no tenderness. There is no rebound.  Musculoskeletal: He exhibits no edema.  Neurological: He is alert and oriented to person, place, and time. Coordination normal.  Skin: Skin is warm and dry.  Psychiatric: He has a normal mood and affect.   Filed Vitals:   01/06/15 1602  BP: 124/74  Pulse: 59  Temp: 98.1 F (36.7 C)  TempSrc: Oral  Resp: 16  Height:  (1.727 m)  Weight: 170 lb 6.4 oz (77.293 kg)  SpO2: 98%      Assessment & Plan:

## 2015-01-27 ENCOUNTER — Other Ambulatory Visit: Payer: Self-pay | Admitting: Internal Medicine

## 2015-03-08 ENCOUNTER — Other Ambulatory Visit: Payer: Self-pay | Admitting: Internal Medicine

## 2015-03-09 ENCOUNTER — Ambulatory Visit (INDEPENDENT_AMBULATORY_CARE_PROVIDER_SITE_OTHER): Payer: BC Managed Care – PPO

## 2015-03-09 DIAGNOSIS — Z23 Encounter for immunization: Secondary | ICD-10-CM

## 2015-04-06 ENCOUNTER — Other Ambulatory Visit: Payer: Self-pay | Admitting: Internal Medicine

## 2015-04-07 ENCOUNTER — Telehealth: Payer: Self-pay | Admitting: *Deleted

## 2015-04-07 DIAGNOSIS — R05 Cough: Secondary | ICD-10-CM

## 2015-04-07 DIAGNOSIS — R059 Cough, unspecified: Secondary | ICD-10-CM

## 2015-04-07 MED ORDER — ALBUTEROL SULFATE HFA 108 (90 BASE) MCG/ACT IN AERS: 2.0000 | INHALATION_SPRAY | RESPIRATORY_TRACT | Status: DC | PRN

## 2015-04-07 MED ORDER — BUDESONIDE-FORMOTEROL FUMARATE 160-4.5 MCG/ACT IN AERO: 2.0000 | INHALATION_SPRAY | Freq: Two times a day (BID) | RESPIRATORY_TRACT | Status: DC

## 2015-04-07 MED ORDER — LOSARTAN POTASSIUM 50 MG PO TABS: ORAL_TABLET | ORAL | Status: DC

## 2015-04-07 NOTE — Telephone Encounter (Signed)
Receive call pt states he is needing refill on his losartan, albuterol & simbicort inhaler. Verified pharmacy inform sending to [facility]...Raechel Chute/lmb

## 2015-04-12 ENCOUNTER — Ambulatory Visit (INDEPENDENT_AMBULATORY_CARE_PROVIDER_SITE_OTHER): Payer: BC Managed Care – PPO | Admitting: Sports Medicine

## 2015-04-12 ENCOUNTER — Encounter: Payer: Self-pay | Admitting: Sports Medicine

## 2015-04-12 VITALS — BP 140/90 | Ht 68.0 in | Wt 171.0 lb

## 2015-04-12 DIAGNOSIS — M79662 Pain in left lower leg: Secondary | ICD-10-CM | POA: Diagnosis not present

## 2015-04-12 DIAGNOSIS — S86812A Strain of other muscle(s) and tendon(s) at lower leg level, left leg, initial encounter: Secondary | ICD-10-CM | POA: Diagnosis not present

## 2015-04-12 NOTE — Assessment & Plan Note (Signed)
See the visit note  He needs aggressive home rehab NTG trial Compression Heel lift  Reck 2 mos

## 2015-04-12 NOTE — Progress Notes (Signed)
Subjective:    Patient ID: Cameron White, male    DOB: October 08, 1970, 44 y.o.   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 He stated that his lateral gastroc became very tight while running up a hill and having a twisting/almost falling movement after stumbling upon stepping in a hole. The patient reports that his pain has been there since that time but he was able to play tennis later that day and again the following day. However, since then he has been unable to run faster than a 10 minute pace and is starting to have calf pain in the lateral/distal gastroc of the left leg with day-to-day activity.   Therefore, he stopped running on 04/08/2015.  The patient has been using nitroglycerin for 4 days with no relief. He made his appointment because of no improvement with nitroglycerin and relative rest. He has not tried any bracing or offloading of the foot. He has used an over-the-counter cath sleeve that he reports is only 3-4 inches long.  He has had prior calf injuries with running  PMHx: -- Reviewed  PSHx: -- Reviewed  Meds: -- Reviewed  Allergies: -- Reviewed  Social Hx: -- Reviewed   Review of Systems 10-point ROS is negative other than detailed above in HPI    Objective:   Physical Exam   General: -- Well appearing and in NAD; resting comfortably on exam table BP 140/90 mmHg  Ht 5\' 8"  (1.727 m)  Wt 171 lb (77.565 kg)  BMI 26.01 kg/m2  Cardiopulmonary: -- Non-labored respirations -- No JVD; 2+ pulses in distal upper and lower extremities  Skin/derm: -- No rashes present on the examined skin of abdomen/chest/back and the extremities  Neurology: -- No focal deficits on interview/exam -- Neurovascular intact in examined extremities  Psych: -- Appropriate mood/affect; normal thought content  Bilateral lower extremity/gastroc  exam: -No apparent defect in the muscle belly or abnormalities on inspection -Active range of motion and passive range of motion are symmetric and appropriate in the bilateral knees in flexion/extension and the ankles and dorsi/plantar flexion -5/5 strength in knee extension/flexion and an ankle dorsiflexion/plantarflexion -Tender to palpation focally in the lateral aspect of the distal one third of the gastrocnemius of the LEFT leg; nontender in the remainder of the left leg and all of the right leg -Negative Thompson test  Musculoskeletal ultrasound of bilateral lower extremities: -The right Achilles, soleus and medial/lateral gastrocnemius were all unremarkable and short and long axis views -The left Achilles tendon was unremarkable in short and long axis views -The left soleus and medial gastrocnemius were unremarkable in short and long axis views -The LEFT lateral gastrocnemius demonstrated significant hyperechoic changes adjacent to vessels (both arterial and venous) consistent with scar tissue formation. 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  #1. LEFT, lateral gastrocnemius acute-on-chronic strain Focal point tenderness within the distal aspect of the lateral gastrocnemius along with sonographic findings of increased Doppler flow along prior scar formation at point of maximal tenderness concerning for an acute on chronic gastrocnemius strain. The patient has been using nitroglycerin patches for the last 4 days  with minimal improvement. He also has been doing relative rest but no bracing/protection of the area.  Plan: -Nitroglycerin patches for complete 12 weeks series -Felt heel lifts provided, placed under  orthotics during walking or slow jog on flat surfaces -Avoid exacerbating activities such as aggressive weight lifting, running up/down hills or running stairs; also avoid jumping and dynamic movement such as plyometrics -Modified Alfredson exercises, starting today and as tolerated/advanced as tolerated by pain/ alternate with PRE in concentric phase qod -Follow up and sports medicine clinic in 4 weeks

## 2015-04-29 ENCOUNTER — Ambulatory Visit (INDEPENDENT_AMBULATORY_CARE_PROVIDER_SITE_OTHER): Payer: BC Managed Care – PPO | Admitting: Family Medicine

## 2015-04-29 ENCOUNTER — Encounter: Payer: Self-pay | Admitting: Family Medicine

## 2015-04-29 ENCOUNTER — Ambulatory Visit
Admission: RE | Admit: 2015-04-29 | Discharge: 2015-04-29 | Disposition: A | Discharge status: Home / Self Care | Payer: BC Managed Care – PPO | Source: Ambulatory Visit | Attending: Family Medicine | Admitting: Family Medicine

## 2015-04-29 VITALS — BP 132/82 | Ht 68.0 in | Wt 165.0 lb

## 2015-04-29 DIAGNOSIS — M25571 Pain in right ankle and joints of right foot: Secondary | ICD-10-CM

## 2015-04-29 DIAGNOSIS — M79671 Pain in right foot: Secondary | ICD-10-CM | POA: Diagnosis not present

## 2015-04-29 NOTE — Progress Notes (Signed)
  Cameron FerrariVincent I White - 44 y.o. male MRN [id number]  Date of birth: 1970/09/27    SUBJECTIVE:     Three days ago, he was on a paddle board. He wiped out and his heel hit the sand hard. No pop but had some swelling and pain in his heel. Most pain is in the lateral heel and medial aspect of ankle below medial malleolus. He has been putting pressure on the ball of his foot. He has been taking Advil 400mg  daily which has not really helped. He has tried icing a little bit.  ROS:     Musculoskeletal: impaired gait, pain, swelling  PERTINENT  PMH / PSH FH / / SH:  Past Medical, Surgical, Social, and Family History Reviewed & Updated in the EMR.  Pertinent findings include:  Hamstring tendonitis, calf strain, patellar tendinitis  OBJECTIVE: BP 132/82 mmHg  Ht 5\' 8"  (1.727 m)  Wt 165 lb (74.844 kg)  BMI 25.09 kg/m2  Physical Exam:  Vital signs are reviewed. General: well appearing, no distress Musculoskeletal Right foot: Minimal swelling on medial aspect compared to left. No gross deformity. Severe tenderness on lateral and plantar aspect of calcaneus. Positive squeeze test. Mild tenderness inferior to medial malleolus. No medial malleolus, navicular or 5th metatarsal tenderness. Slight posterior lateral malleolus tenderness. 5/5 dorsi/plantarflexion, eversion/inversion. Patient cannot bear weight on right heel without pain. 2+ achilles tendon reflex.   Dg Ankle Complete Right  04/29/2015  CLINICAL DATA:  Right ankle pain, injury on surfboard 3 days ago EXAM: RIGHT ANKLE - COMPLETE 3+ VIEW COMPARISON:  None. FINDINGS: Three views of the right ankle submitted. No acute fracture or subluxation. No radiopaque foreign body. Ankle mortise is preserved. Small plantar spur of calcaneus. IMPRESSION: No acute fracture or subluxation.  Small plantar spur of calcaneus. Electronically Signed   By: Natasha MeadLiviu  Pop M.D.   On: 04/29/2015 11:16   Dg Os Calcis Right  04/29/2015  CLINICAL DATA:  Right ankle pain, injury 3  days ago EXAM: RIGHT OS CALCIS - 1+ VIEW COMPARISON:  None. FINDINGS: There is no evidence of fracture or other focal bone lesions. Soft tissues are unremarkable. IMPRESSION: Negative. Electronically Signed   By: Natasha MeadLiviu  Pop M.D.   On: 04/29/2015 11:18    ASSESSMENT & PLAN:  See problem based charting & AVS for pt instructions.

## 2015-04-29 NOTE — Assessment & Plan Note (Signed)
Patient's mechanism of injury does not appear to be high enough impact to cause calcaneal fracture, however, physical exam is very suspicious for such a fracture. Initial x-rays negative for acute fracture. Patient still not able to bear weight. Will place in CAM walker and keep non-weight bearing. Will obtain CT of right foot to assess calcaneus better.

## 2015-05-02 NOTE — Progress Notes (Signed)
Patient ID: Cameron White, male   DOB: Jun 12, 1970, 44 y.o.   MRN: [id number] [facility] Attending Note: I have seen and examined this patient. I have discussed this patient with the resident and reviewed the assessment and plan as documented above. I agree with the resident's findings and plan. I am concerned for calcaneal fracture as his squeeze test quite positive. Will get CT scan Crutches, cam walker

## 2015-05-04 ENCOUNTER — Other Ambulatory Visit: Payer: BC Managed Care – PPO

## 2015-05-04 ENCOUNTER — Inpatient Hospital Stay: Admission: RE | Admit: 2015-05-04 | Payer: BC Managed Care – PPO | Source: Ambulatory Visit

## 2015-05-07 ENCOUNTER — Other Ambulatory Visit: Payer: Self-pay | Admitting: Internal Medicine

## 2015-05-16 ENCOUNTER — Inpatient Hospital Stay: Admission: RE | Admit: 2015-05-16 | Payer: BC Managed Care – PPO | Source: Ambulatory Visit

## 2015-08-11 ENCOUNTER — Ambulatory Visit (INDEPENDENT_AMBULATORY_CARE_PROVIDER_SITE_OTHER): Payer: BC Managed Care – PPO | Admitting: Sports Medicine

## 2015-08-11 ENCOUNTER — Encounter: Payer: Self-pay | Admitting: Sports Medicine

## 2015-08-11 VITALS — BP 134/87 | Ht 68.0 in | Wt 165.0 lb

## 2015-08-11 DIAGNOSIS — M79671 Pain in right foot: Secondary | ICD-10-CM

## 2015-08-11 DIAGNOSIS — M25561 Pain in right knee: Secondary | ICD-10-CM | POA: Diagnosis not present

## 2015-08-11 NOTE — Progress Notes (Signed)
Subjective:  Cameron White is a 45 y.o. male in today for evaluation of worsening knee pain.  Cameron White has a history of multiple musculoskeletal injuries.  Knee pain in worse on the inferior in the patellar tendon. Pain noticed with exercising.  Long term runner Pain hurts after the run but not during Deeper squats are painful  ROS:  No giving way No locking Denies swelling  SH: Works at [employer]. Non-smoker.    Objective:  BP 134/87 mmHg  Ht  (1.727 m)  Wt 165 lb (74.844 kg)  BMI 25.09 kg/m2 Physical Exam:   General: No acute distress.  Skin:  Skin intact with rash or notable edema.  MSK:  Right knee- bony protrusion on the anterior surface of distal patella; also bony prominance located at  the tibial tuberosity.  Negative Lachman's/Valgus/Varus/McMurray's test.  Patella mobility restricted with downward movement.   Strength is excellent No TTP over patellar or quad tendon  Left knee:   Negative Lachman's/Valgus/Varus/McMurray's test.  Patella mobility easily mobile with upward and downward movement.  Mild prominence of tib tubercle  Ultrasound (Knee):  Multiple bony spurs visualized on both knees.  Growth plates not completely closed at distal patellar pole on RT and slt on left, appears chronic in nature with no hypoechoic change PT tendon is thickened RT with no abnormalities; tibial tubercle shows irregularity cons with remote OG Schlatter Spurs superior patella into QT on RT   Assessment/Plan:  Cameron White is a 45 y.o. male in today for evaluation of worsening knee pain.  Knee pain likely associated with open growth plates left over from adolescence with resultant overuse resulting in tenderness.  This is changing his patellar tracking.  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    Recommendations:   1.  RT work on easy movement of patella  2.  Exercises: Short arc quads sitting,  Straight leg raise, Wall sits with short flexions, Biking 3.  Recommended to try patellar strap 4.  To help with pain Ice knees after running

## 2015-08-11 NOTE — Patient Instructions (Signed)
You have open growth plates left over from adolescence  sindig larsen johanssen and osgood schlatters  RT work on IT consultant arc quads sitting Straight leg raise Wall sits with short flexions Biking  Try patellar strap Ice bones after running

## 2015-11-17 ENCOUNTER — Ambulatory Visit (INDEPENDENT_AMBULATORY_CARE_PROVIDER_SITE_OTHER): Payer: BC Managed Care – PPO | Admitting: Sports Medicine

## 2015-11-17 ENCOUNTER — Encounter: Payer: Self-pay | Admitting: Sports Medicine

## 2015-11-17 VITALS — BP 138/83 | Ht 68.0 in | Wt 165.0 lb

## 2015-11-17 DIAGNOSIS — S76202A Unspecified injury of adductor muscle, fascia and tendon of left thigh, initial encounter: Secondary | ICD-10-CM

## 2015-11-17 DIAGNOSIS — M7651 Patellar tendinitis, right knee: Secondary | ICD-10-CM

## 2015-11-17 MED ORDER — NITROGLYCERIN 0.2 MG/HR TD PT24: MEDICATED_PATCH | TRANSDERMAL | Status: DC

## 2015-11-17 NOTE — Assessment & Plan Note (Addendum)
He has chronic calcific patellar and quad tendinopathy - more on RT but present on LT Spurs seems inflamed by US He has not tried NTG regularly so we will give this a 3 mo trial Rehab with mini-squats in 3 positions Biking is good but keep tension low Rescan 2 to 3 mos

## 2015-11-17 NOTE — Progress Notes (Signed)
Patient ID: Cameron White, male   DOB: 13-Jun-1970, 45 y.o.   MRN: [id number]  Patient returns for RT knee pain  Patient with long hx of running and sports Seen in March with anterior knee pain Calcific spurring noted in QT and PT both R and L More noted on R In addition the tibial tubercle had incomplete closure and a large spur on R Incomplete closure on L  Now having more anterior knee pain mostly over PT on RT After running More after tennis After biking if he pushes hard  Has occ knee pain on left more over QT after sports  R knee now seems tight and maybe swollen some evenings  Soc: lives with wife and 2 kids Non smoker  ROS No giving way No locking No other joint issues at present that are painful  PE Gen WDWM in NAD BP 138/83 mmHg  Ht 5\' 8"  (1.727 m)  Wt 165 lb (74.844 kg)  BMI 25.09 kg/m2  RT Knee: Normal to inspection with no erythema or effusion or obvious bony abnormalities. Palpation normal with no warmth or joint line tenderness or patellar tenderness or condyle tenderness. ROM normal in flexion and extension and lower leg rotation. Ligaments with solid consistent endpoints including ACL, PCL, LCL, MCL. Negative Mcmurray's and provocative meniscal tests. Non painful patellar compression. Mild TTP at distal patella and at tib tub Prominent tib tub Patellar and quadriceps tendons unremarkable appearance Hamstring and quadriceps strength is normal. Hip abduction is strong  LT KNEE Knee: Normal to inspection with no erythema or effusion or obvious bony abnormalities. Palpation normal with no warmth or joint line tenderness or patellar tenderness or condyle tenderness. ROM normal in flexion and extension and lower leg rotation. Ligaments with solid consistent endpoints including ACL, PCL, LCL, MCL. Negative Mcmurray's and provocative meniscal tests. Non painful patellar compression. Patellar and quadriceps tendons unremarkable. Hamstring and  quadriceps strength is normal.  US OF BOTH KNEES RT There are 3 suprapatellar spurs on RT patella These do not have increased doppler flow and tendon looks intact SPP with no swelling Patellar tendon is thickened proximally Large distal spur with inc doppler flow Distal insertion to Tib Tub with large spur/ marked inc flow Irregularity at tub  LT Spurring of sup patella and this has assoc inc doppler flow No SPP effusion Patellar tendon spurring proximal with some calcification but no inc doppler flow Distal shows tib tub slt irregular but no spurring

## 2015-11-22 IMAGING — CR DG CHEST 2V
2 series · 2 of 2 positions shown · non-contrast
Comparison: None.

CLINICAL DATA: Cough for 2 months.

EXAM:
CHEST  2 VIEW

[view not recorded (1 of 2)]
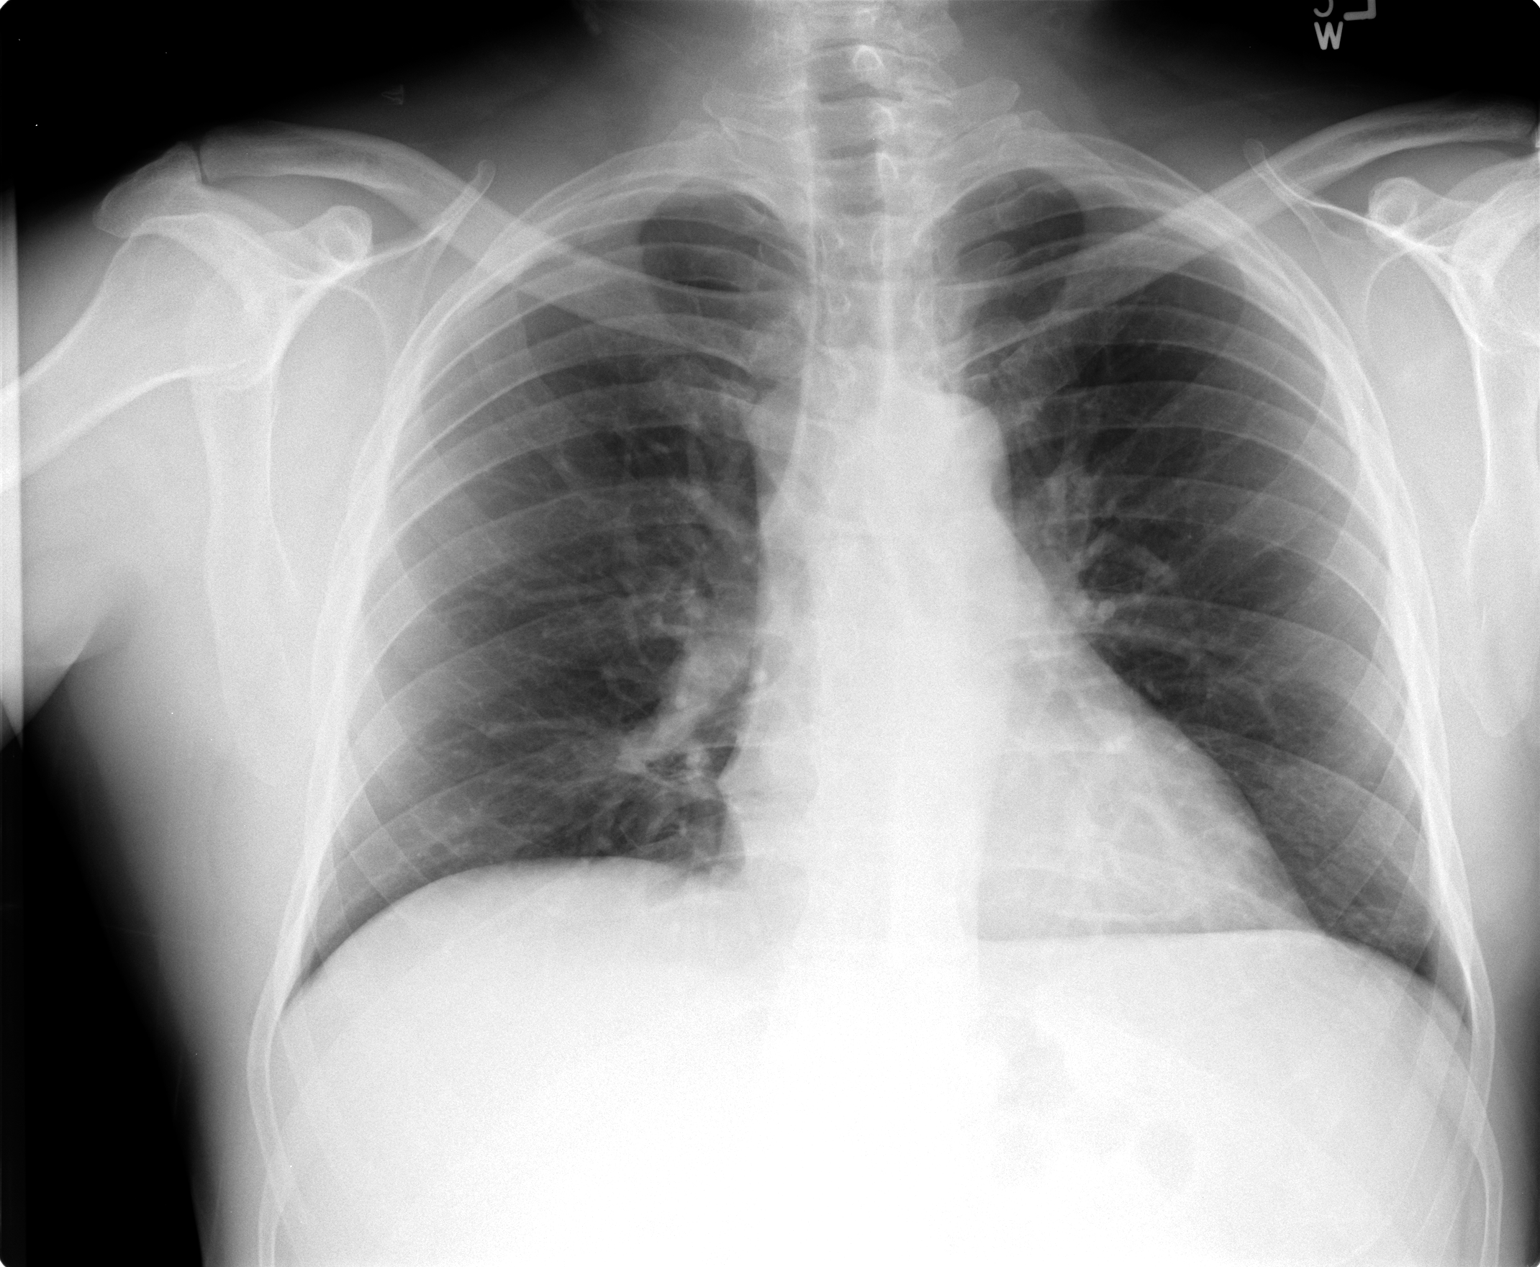

[view not recorded (2 of 2)]
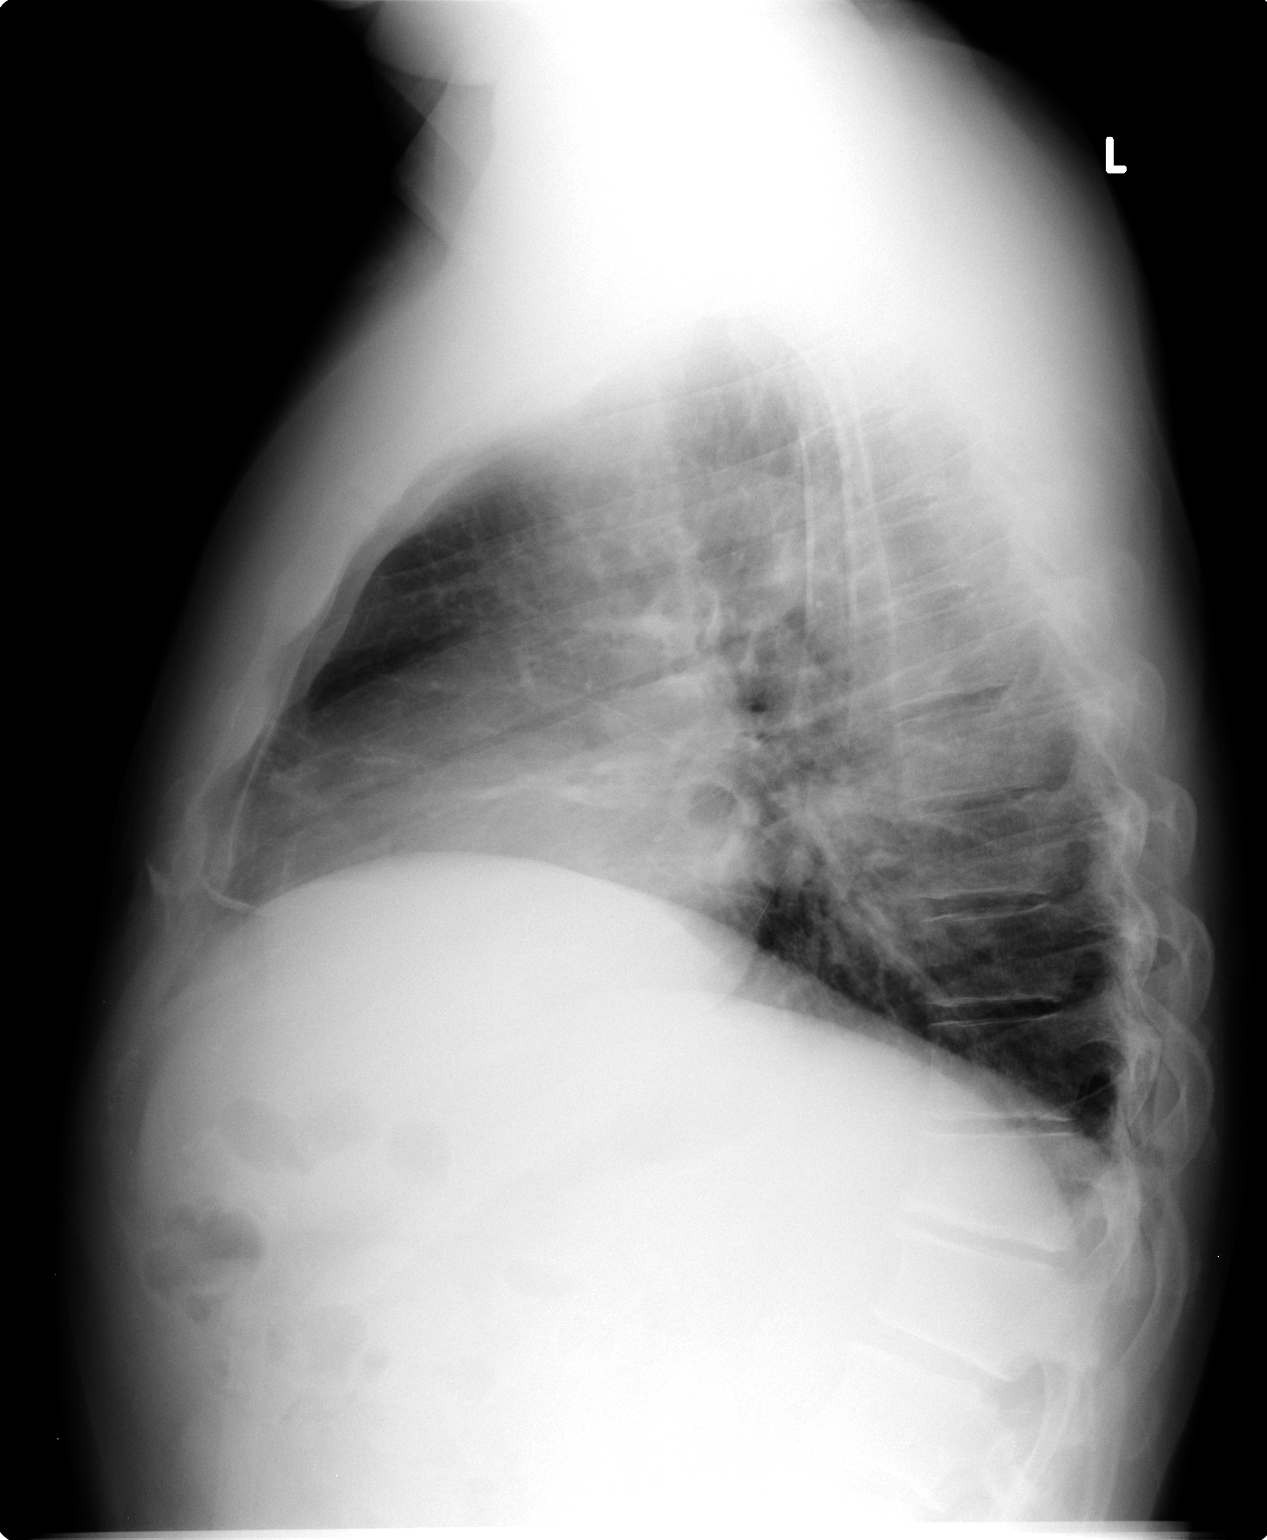

[2 of 2 positions shown; findings below may reference images not displayed]

FINDINGS: The cardiomediastinal silhouette is within normal limits. The lungs
are slightly hypoinflated and clear. There is no evidence of pleural
effusion or pneumothorax. No acute osseous abnormality is
identified.
IMPRESSION: No active cardiopulmonary disease.

## 2015-12-07 ENCOUNTER — Other Ambulatory Visit: Payer: Self-pay | Admitting: Internal Medicine

## 2016-01-16 ENCOUNTER — Other Ambulatory Visit: Payer: Self-pay | Admitting: Internal Medicine

## 2016-02-03 ENCOUNTER — Encounter: Payer: Self-pay | Admitting: Internal Medicine

## 2016-02-03 ENCOUNTER — Ambulatory Visit (INDEPENDENT_AMBULATORY_CARE_PROVIDER_SITE_OTHER): Payer: BC Managed Care – PPO | Admitting: Internal Medicine

## 2016-02-03 DIAGNOSIS — J4521 Mild intermittent asthma with (acute) exacerbation: Secondary | ICD-10-CM

## 2016-02-03 MED ORDER — PREDNISONE 20 MG PO TABS: 40.0000 mg | ORAL_TABLET | Freq: Every day | ORAL | 0 refills | Status: DC

## 2016-02-03 MED ORDER — FLUTICASONE PROPIONATE 50 MCG/ACT NA SUSP: 2.0000 | Freq: Every day | NASAL | 6 refills | Status: DC

## 2016-02-03 NOTE — Patient Instructions (Signed)
We would like you to start taking flonase today for the drainage. Use 2 sprays in each nostril daily.   We have sent in prednisone in case the symptoms do not improve. If you are not feeling better by Sunday start taking the prednisone. If needed take 2 pills daily for 5 days.

## 2016-02-03 NOTE — Progress Notes (Signed)
   Subjective:    Patient ID: Cameron White, male    DOB: Nov 22, 1970, 45 y.o.   MRN: [id number]  HPI The patient is a 45 YO man coming in for cough. Started about 1 week ago. Mild sinus symptoms but not prominent. No nasal drainage or ear pain. He does have asthma. He is taking his symbicort regularly. Has not used his albuterol at all. Denies SOB or limitation to activities. Denies sick contacts.   Review of Systems  Constitutional: Negative for activity change, appetite change, fatigue and fever.  HENT: Positive for congestion and postnasal drip. Negative for nosebleeds, rhinorrhea, sinus pressure and sore throat.   Eyes: Negative.   Respiratory: Positive for cough. Negative for chest tightness, shortness of breath and wheezing.   Cardiovascular: Negative for chest pain, palpitations and leg swelling.  Gastrointestinal: Negative for abdominal distention, abdominal pain, constipation, diarrhea and nausea.  Musculoskeletal: Negative.   Skin: Negative.   Neurological: Negative.       Objective:   Physical Exam  Constitutional: He is oriented to person, place, and time. He appears well-developed and well-nourished.  HENT:  Head: Normocephalic and atraumatic.  Oropharynx with erythema and clear drainage.   Eyes: EOM are normal.  Neck: Normal range of motion.  Cardiovascular: Normal rate and regular rhythm.   Pulmonary/Chest: Effort normal. No respiratory distress. He has no wheezes. He has no rales.  Abdominal: Soft. Bowel sounds are normal. He exhibits no distension. There is no tenderness. There is no rebound.  Musculoskeletal: He exhibits no edema.  Neurological: He is alert and oriented to person, place, and time. Coordination normal.  Skin: Skin is warm and dry.   Vitals:   02/03/16 1106  BP: 130/82  Pulse: 67  Resp: 12  Temp: 98.3 F (36.8 C)  TempSrc: Oral  SpO2: 95%  Weight: 170 lb (77.1 kg)  Height: 5\' 8"  (1.727 m)      Assessment & Plan:

## 2016-02-03 NOTE — Progress Notes (Signed)
Pre visit review using our clinic review tool, if applicable. No additional management support is needed unless otherwise documented below in the visit note. 

## 2016-02-03 NOTE — Assessment & Plan Note (Signed)
He is taking the symbicort and not using his albuterol but he does have it. Not true flare. Will have him start flonase for the drainage and given rx for prednisone in case his SOB develops or symptoms worsen.

## 2016-02-10 ENCOUNTER — Telehealth: Payer: Self-pay | Admitting: Internal Medicine

## 2016-02-10 ENCOUNTER — Ambulatory Visit (INDEPENDENT_AMBULATORY_CARE_PROVIDER_SITE_OTHER): Payer: BC Managed Care – PPO | Admitting: Internal Medicine

## 2016-02-10 ENCOUNTER — Encounter: Payer: Self-pay | Admitting: Internal Medicine

## 2016-02-10 VITALS — BP 122/72 | HR 56 | Temp 98.0°F | Resp 20 | Wt 170.0 lb

## 2016-02-10 DIAGNOSIS — I10 Essential (primary) hypertension: Secondary | ICD-10-CM

## 2016-02-10 DIAGNOSIS — R05 Cough: Secondary | ICD-10-CM | POA: Diagnosis not present

## 2016-02-10 DIAGNOSIS — J4521 Mild intermittent asthma with (acute) exacerbation: Secondary | ICD-10-CM | POA: Diagnosis not present

## 2016-02-10 DIAGNOSIS — R059 Cough, unspecified: Secondary | ICD-10-CM

## 2016-02-10 DIAGNOSIS — R042 Hemoptysis: Secondary | ICD-10-CM | POA: Insufficient documentation

## 2016-02-10 MED ORDER — LEVOFLOXACIN 500 MG PO TABS: 500.0000 mg | ORAL_TABLET | Freq: Every day | ORAL | 0 refills | Status: AC

## 2016-02-10 MED ORDER — HYDROCODONE-HOMATROPINE 5-1.5 MG/5ML PO SYRP: 5.0000 mL | ORAL_SOLUTION | Freq: Four times a day (QID) | ORAL | 0 refills | Status: AC | PRN

## 2016-02-10 NOTE — Progress Notes (Signed)
Pre visit review using our clinic review tool, if applicable. No additional management support is needed unless otherwise documented below in the visit note. 

## 2016-02-10 NOTE — Telephone Encounter (Signed)
Appt scheduled. Closing note.

## 2016-02-10 NOTE — Patient Instructions (Signed)
Please take all new medication as prescribed - the antibiotic, and the cough medicine if needed  Please continue all other medications as before, and refills have been done if requested.  Please have the pharmacy call with any other refills you may need.  Please keep your appointments with your specialists as you may have planned  Please go to the XRAY Department in the Basement (go straight as you get off the elevator) for the x-ray testing if you are not improved by [facility] at your suggestion  You will be contacted by phone if any changes need to be made immediately.  Otherwise, you will receive a letter about your results with an explanation, but please check with MyChart first.  Please remember to sign up for MyChart if you have not done so, as this will be important to you in the future with finding out test results, communicating by private email, and scheduling acute appointments online when needed.

## 2016-02-10 NOTE — Progress Notes (Signed)
Subjective:    Patient ID: Cameron White, male    DOB: July 18, 1970, 45 y.o.   MRN: [id number]  HPI  Here with acute onset mild to mod 2-3 days ST, HA, general weakness and malaise, with prod cough greenish sputum as well as several episodes small BRB lacing the product, but Pt denies chest pain, increased sob or doe, wheezing, orthopnea, PND, increased LE swelling, palpitations, dizziness or syncope. Past Medical History:  Diagnosis Date  . Achilles tendinitis on left   . ASTHMA   . GERD   . HALLUX RIGIDUS, ACQUIRED   . Hypertension   . Patellar tendinitis   . SACROILIAC JOINT DYSFUNCTION    Past Surgical History:  Procedure Laterality Date  . NO PAST SURGERIES      reports that he has never smoked. He has never used smokeless tobacco. He reports that he drinks about 2.4 oz of alcohol per week . He reports that he does not use drugs. family history includes Alcohol abuse in his other; Heart disease in his paternal grandfather; Hypertension in his father, other, and paternal grandfather. No Known Allergies Current Outpatient Prescriptions on File Prior to Visit  Medication Sig Dispense Refill  . albuterol (PROVENTIL HFA) 108 (90 BASE) MCG/ACT inhaler Inhale 2 puffs into the lungs every 4 (four) hours as needed. 1 Inhaler 2  . budesonide-formoterol (SYMBICORT) 160-4.5 MCG/ACT inhaler Inhale 2 puffs into the lungs 2 (two) times daily. 1 Inhaler 5  . fluticasone (FLONASE) 50 MCG/ACT nasal spray Place 2 sprays into both nostrils daily. 16 g 6  . ketoconazole (NIZORAL) 2 % cream APP AA BID  2  . losartan (COZAAR) 50 MG tablet TAKE 1 TABLET(50 MG) BY MOUTH DAILY 90 tablet 1  . losartan (COZAAR) 50 MG tablet TAKE 1 TABLET(50 MG) BY MOUTH DAILY 30 tablet 0  . nitroGLYCERIN (NITRODUR - DOSED IN MG/24 HR) 0.2 mg/hr patch You will use 1/4 patch daily to affected area 30 patch 3  . omeprazole (PRILOSEC) 20 MG capsule Take 1 capsule (20 mg total) by mouth daily. 30 capsule 3  . predniSONE  (DELTASONE) 20 MG tablet Take 2 tablets (40 mg total) by mouth daily with breakfast. 10 tablet 0   No current facility-administered medications on file prior to visit.    Review of Systems  Constitutional: Negative for unusual diaphoresis or night sweats HENT: Negative for ear swelling or discharge Eyes: Negative for worsening visual haziness  Respiratory: Negative for choking and stridor.   Gastrointestinal: Negative for distension or worsening eructation Genitourinary: Negative for retention or change in urine volume.  Musculoskeletal: Negative for other MSK pain or swelling Skin: Negative for color change and worsening wound Neurological: Negative for tremors and numbness other than noted  Psychiatric/Behavioral: Negative for decreased concentration or agitation other than above       Objective:   Physical Exam BP 122/72   Pulse (!) 56   Temp 98 F (36.7 C) (Oral)   Resp 20   Wt 170 lb (77.1 kg)   SpO2 97%   BMI 25.85 kg/m  VS noted, mild ill Constitutional: Pt appears in no apparent distress HENT: Head: NCAT.  Right Ear: External ear normal.  Left Ear: External ear normal.  Eyes: . Pupils are equal, round, and reactive to light. Conjunctivae and EOM are normal Bilat tm's with mild erythema.  Max sinus areas non tender.  Pharynx with mild erythema, no exudate Neck: Normal range of motion. Neck supple.  Cardiovascular: Normal rate and  regular rhythm.   Pulmonary/Chest: Effort normal and breath sounds somewhat decreased without rales or wheezing.  Neurological: Pt is alert. Not confused , motor grossly intact Skin: Skin is warm. No rash, no LE edema Psychiatric: Pt behavior is normal. No agitation.     Assessment & Plan:

## 2016-02-10 NOTE — Telephone Encounter (Signed)
Patient Name: Cameron FriarVINCE Zia  DOB: 10/06/1970    Initial Comment Caller states has been coughing up blood   Nurse Assessment  Nurse: Stefano GaulStringer, RN, Dwana CurdVera Date/Time (Eastern Time): 02/10/2016 9:55:57 AM  Confirm and document reason for call. If symptomatic, describe symptoms. You must click the next button to save text entered. ---Caller states he has coughed up blood in the mornings. He is coughing up blood streaked mucus. No fever. No chest pain. Saw Dr. Okey Duprerawford about 10 days ago. Finished his prednisone.  Has the patient traveled out of the country within the last 30 days? ---No  Does the patient have any new or worsening symptoms? ---Yes  Will a triage be completed? ---Yes  Related visit to physician within the last 2 weeks? ---Yes  Does the PT have any chronic conditions? (i.e. diabetes, asthma, etc.) ---Yes  List chronic conditions. ---exercise induced asthma  Is this a behavioral health or substance abuse call? ---No     Guidelines    Guideline Title Affirmed Question Affirmed Notes  Coughing Up Blood [1] More than one episode of coughing up blood AND [2] < 1 tablespoon (15 ml) of pure red blood    Final Disposition User   See PCP When Office is Open (within 3 days) Stefano GaulStringer, RN, Dwana CurdVera    Comments  appt scheduled for 02/10/16 at 11:15 am with Dr. Oliver BarreJames John   Referrals  REFERRED TO PCP OFFICE   Disagree/Comply: Comply

## 2016-02-11 NOTE — Assessment & Plan Note (Signed)
Exam most c/w bronchitis, cant r/o pna, declines cxr, Mild to mod, for antibx course,  to f/u any worsening symptoms or concerns

## 2016-02-11 NOTE — Assessment & Plan Note (Signed)
stable overall by history and exam, recent data reviewed with pt, and pt to continue medical treatment as before,  to f/u any worsening symptoms or concerns BP Readings from Last 3 Encounters:  02/10/16 122/72  02/03/16 130/82  11/17/15 138/83

## 2016-02-11 NOTE — Assessment & Plan Note (Signed)
stable overall by history and exam, recent data reviewed with pt, and pt to continue medical treatment as before,  to f/u any worsening symptoms or concerns @LASTSAO2(3)@  

## 2016-02-11 NOTE — Assessment & Plan Note (Signed)
Most likely related to infectious etiology, declines cxr,  to f/u any worsening symptoms or concerns

## 2016-02-14 ENCOUNTER — Ambulatory Visit (INDEPENDENT_AMBULATORY_CARE_PROVIDER_SITE_OTHER)
Admission: RE | Admit: 2016-02-14 | Discharge: 2016-02-14 | Disposition: A | Discharge status: Home / Self Care | Payer: BC Managed Care – PPO | Source: Ambulatory Visit | Attending: Internal Medicine | Admitting: Internal Medicine

## 2016-02-14 ENCOUNTER — Encounter: Payer: Self-pay | Admitting: Internal Medicine

## 2016-02-14 DIAGNOSIS — R042 Hemoptysis: Secondary | ICD-10-CM | POA: Diagnosis not present

## 2016-02-14 DIAGNOSIS — R05 Cough: Secondary | ICD-10-CM

## 2016-02-14 DIAGNOSIS — R059 Cough, unspecified: Secondary | ICD-10-CM

## 2016-02-17 ENCOUNTER — Other Ambulatory Visit: Payer: Self-pay | Admitting: Internal Medicine

## 2016-03-06 ENCOUNTER — Ambulatory Visit (INDEPENDENT_AMBULATORY_CARE_PROVIDER_SITE_OTHER): Payer: BC Managed Care – PPO | Admitting: Internal Medicine

## 2016-03-06 ENCOUNTER — Encounter: Payer: Self-pay | Admitting: Internal Medicine

## 2016-03-06 DIAGNOSIS — K219 Gastro-esophageal reflux disease without esophagitis: Secondary | ICD-10-CM | POA: Diagnosis not present

## 2016-03-06 MED ORDER — PANTOPRAZOLE SODIUM 40 MG PO TBEC: 40.0000 mg | DELAYED_RELEASE_TABLET | Freq: Every day | ORAL | 3 refills | Status: DC

## 2016-03-06 MED ORDER — SILDENAFIL CITRATE 100 MG PO TABS: 50.0000 mg | ORAL_TABLET | Freq: Every day | ORAL | 11 refills | Status: DC | PRN

## 2016-03-06 NOTE — Progress Notes (Signed)
Pre visit review using our clinic review tool, if applicable. No additional management support is needed unless otherwise documented below in the visit note. 

## 2016-03-06 NOTE — Patient Instructions (Signed)
We have sent in the protonix for the gerd as well as the viagra to try if needed.   Food Choices for Gastroesophageal Reflux Disease, Adult When you have gastroesophageal reflux disease (GERD), the foods you eat and your eating habits are very important. Choosing the right foods can help ease the discomfort of GERD. WHAT GENERAL GUIDELINES DO I NEED TO FOLLOW?  Choose fruits, vegetables, whole grains, low-fat dairy products, and low-fat meat, fish, and poultry.  Limit fats such as oils, salad dressings, butter, nuts, and avocado.  Keep a food diary to identify foods that cause symptoms.  Avoid foods that cause reflux. These may be different for different people.  Eat frequent small meals instead of three large meals each day.  Eat your meals slowly, in a relaxed setting.  Limit fried foods.  Cook foods using methods other than frying.  Avoid drinking alcohol.  Avoid drinking large amounts of liquids with your meals.  Avoid bending over or lying down until 2-3 hours after eating. WHAT FOODS ARE NOT RECOMMENDED? The following are some foods and drinks that may worsen your symptoms: Vegetables Tomatoes. Tomato juice. Tomato and spaghetti sauce. Chili peppers. Onion and garlic. Horseradish. Fruits Oranges, grapefruit, and lemon (fruit and juice). Meats High-fat meats, fish, and poultry. This includes hot dogs, ribs, ham, sausage, salami, and bacon. Dairy Whole milk and chocolate milk. Sour cream. Cream. Butter. Ice cream. Cream cheese.  Beverages Coffee and tea, with or without caffeine. Carbonated beverages or energy drinks. Condiments Hot sauce. Barbecue sauce.  Sweets/Desserts Chocolate and cocoa. Donuts. Peppermint and spearmint. Fats and Oils High-fat foods, including [place] fries and potato chips. Other Vinegar. Strong spices, such as black pepper, white pepper, red pepper, cayenne, curry powder, cloves, ginger, and chili powder. The items listed above may not be a  complete list of foods and beverages to avoid. Contact your dietitian for more information.   This information is not intended to replace advice given to you by your health care provider. Make sure you discuss any questions you have with your health care provider.   Document Released: 05/28/2005 Document Revised: 06/18/2014 Document Reviewed: 04/01/2013 Elsevier Interactive Patient Education [employer]2016 Elsevier Inc.

## 2016-03-06 NOTE — Assessment & Plan Note (Addendum)
Worsening symptoms and he has stopped coffee and sodas. Rx for protonix given that his OTC dose omeprazole is not helping. Lungs clear and no signs of allergies on exam. Return or call if no improvement.

## 2016-03-06 NOTE — Progress Notes (Signed)
   Subjective:    Patient ID: Cameron White, male    DOB: 06-15-1970, 45 y.o.   MRN: [id number]  HPI The patient is a 45 YO man coming in for cough. He does have asthma but this is not typical for a flare. No SOB or breathing problems. He is on omeprazole and feels that this is his heartburn. He has not changed his diet much. Stopped drinking coffee and soda to try to help but did not notice any benefit. Symptoms started about 2 weeks ago. Non-productive cough. No fevers or chills. No nasal drainage or sinus pressure.   Review of Systems  Constitutional: Negative for activity change, appetite change, fatigue and fever.  HENT: Negative for congestion, nosebleeds, postnasal drip, rhinorrhea, sinus pressure and sore throat.   Eyes: Negative.   Respiratory: Positive for cough. Negative for chest tightness, shortness of breath and wheezing.   Cardiovascular: Negative for chest pain, palpitations and leg swelling.  Gastrointestinal: Negative for abdominal distention, abdominal pain, constipation, diarrhea and nausea.       GERD  Musculoskeletal: Negative.   Skin: Negative.   Neurological: Negative.       Objective:   Physical Exam  Constitutional: He is oriented to person, place, and time. He appears well-developed and well-nourished.  HENT:  Head: Normocephalic and atraumatic.  Right Ear: External ear normal.  Left Ear: External ear normal.  Mouth/Throat: Oropharynx is clear and moist.  Eyes: EOM are normal.  Neck: Normal range of motion.  Cardiovascular: Normal rate and regular rhythm.   Pulmonary/Chest: Effort normal and breath sounds normal. No respiratory distress. He has no wheezes. He has no rales.  Abdominal: Soft. Bowel sounds are normal. He exhibits no distension. There is no tenderness. There is no rebound.  Musculoskeletal: He exhibits no edema.  Neurological: He is alert and oriented to person, place, and time. Coordination normal.  Skin: Skin is warm and dry.   Vitals:     03/06/16 1533  BP: 136/82  Pulse: 77  Resp: 14  Temp: 98.3 F (36.8 C)  TempSrc: Oral  SpO2: 97%  Weight: 169 lb 1.9 oz (76.7 kg)  Height: 5\' 8"  (1.727 m)      Assessment & Plan:

## 2016-03-20 ENCOUNTER — Other Ambulatory Visit: Payer: Self-pay | Admitting: Internal Medicine

## 2016-06-15 IMAGING — CR DG OS CALCIS 2+V*R*
1 series · 1 of 1 positions shown · non-contrast
Comparison: None.

CLINICAL DATA: Right ankle pain, injury 3 days ago

EXAM:
RIGHT OS CALCIS - 1+ VIEW

[t calcaneus axial right]
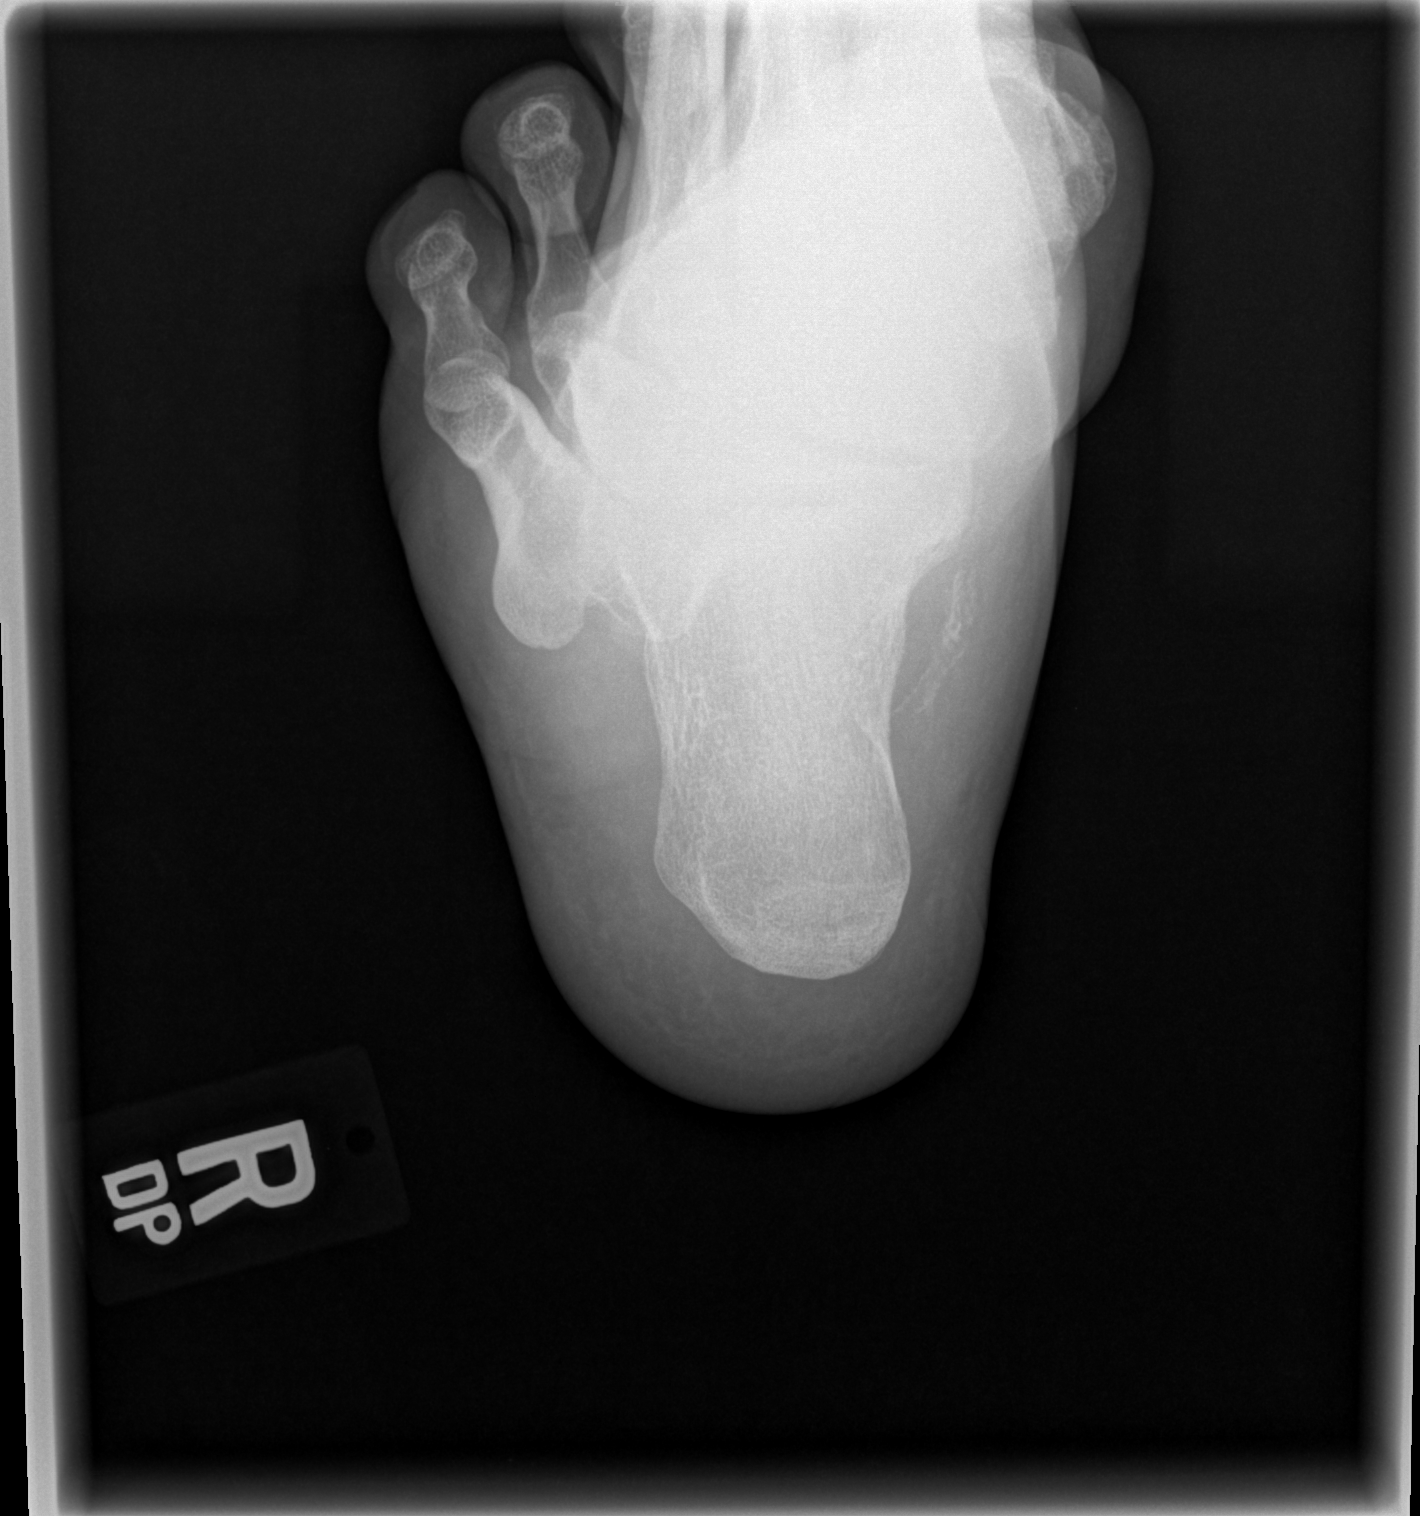

[1 of 1 positions shown; findings below may reference images not displayed]

FINDINGS: There is no evidence of fracture or other focal bone lesions. Soft
tissues are unremarkable.
IMPRESSION: Negative.

## 2016-06-15 IMAGING — CR DG ANKLE COMPLETE 3+V*R*
3 series · 3 of 3 positions shown · non-contrast
Comparison: None.

CLINICAL DATA: Right ankle pain, injury on surfboard 3 days ago

EXAM:
RIGHT ANKLE - COMPLETE 3+ VIEW

[t ankle joint ap right]
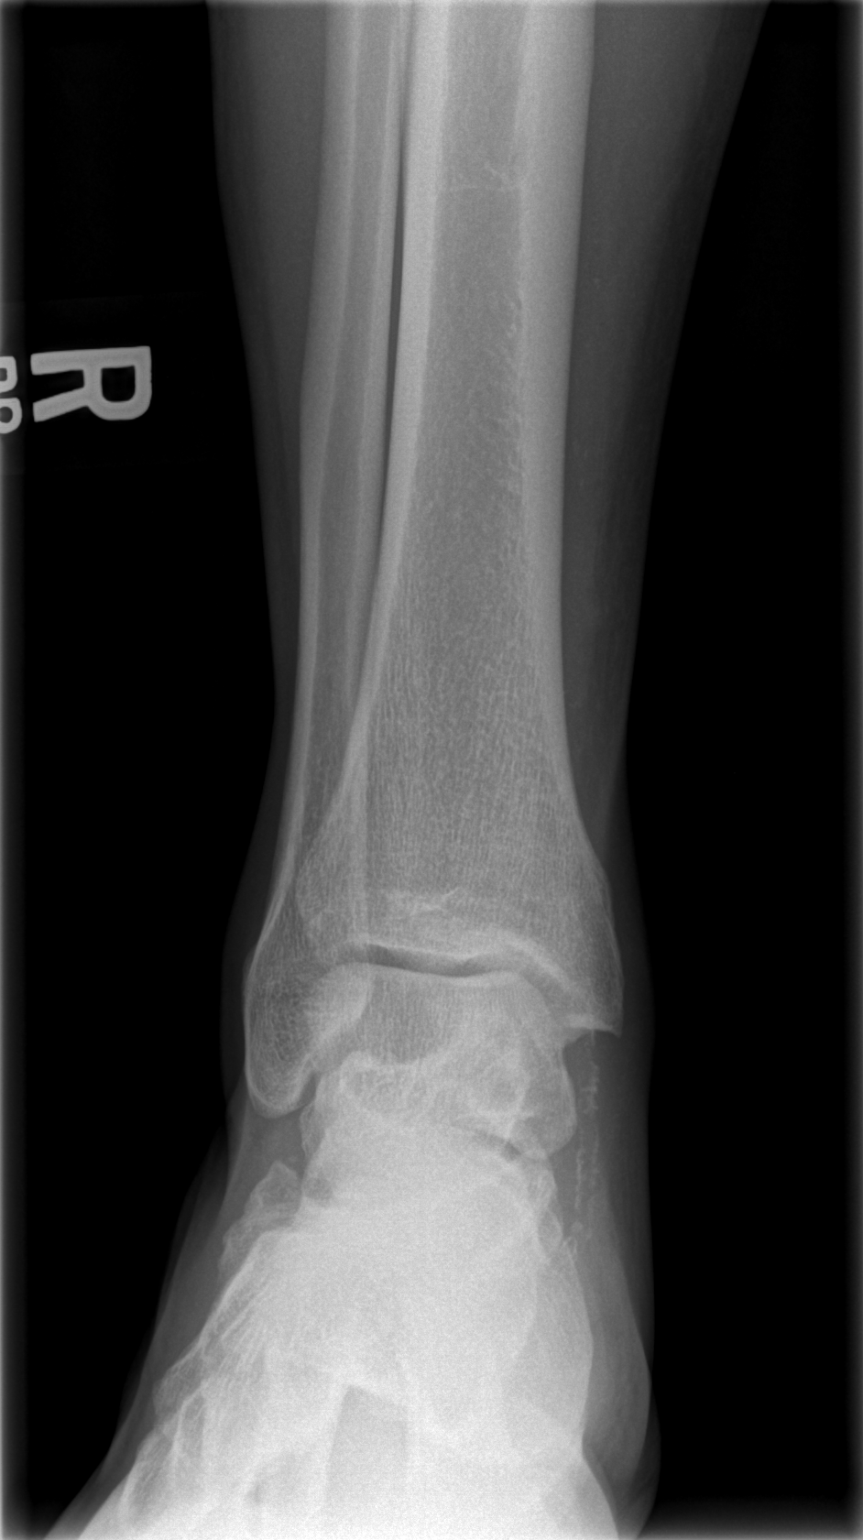

[t ankle joint oblique right]
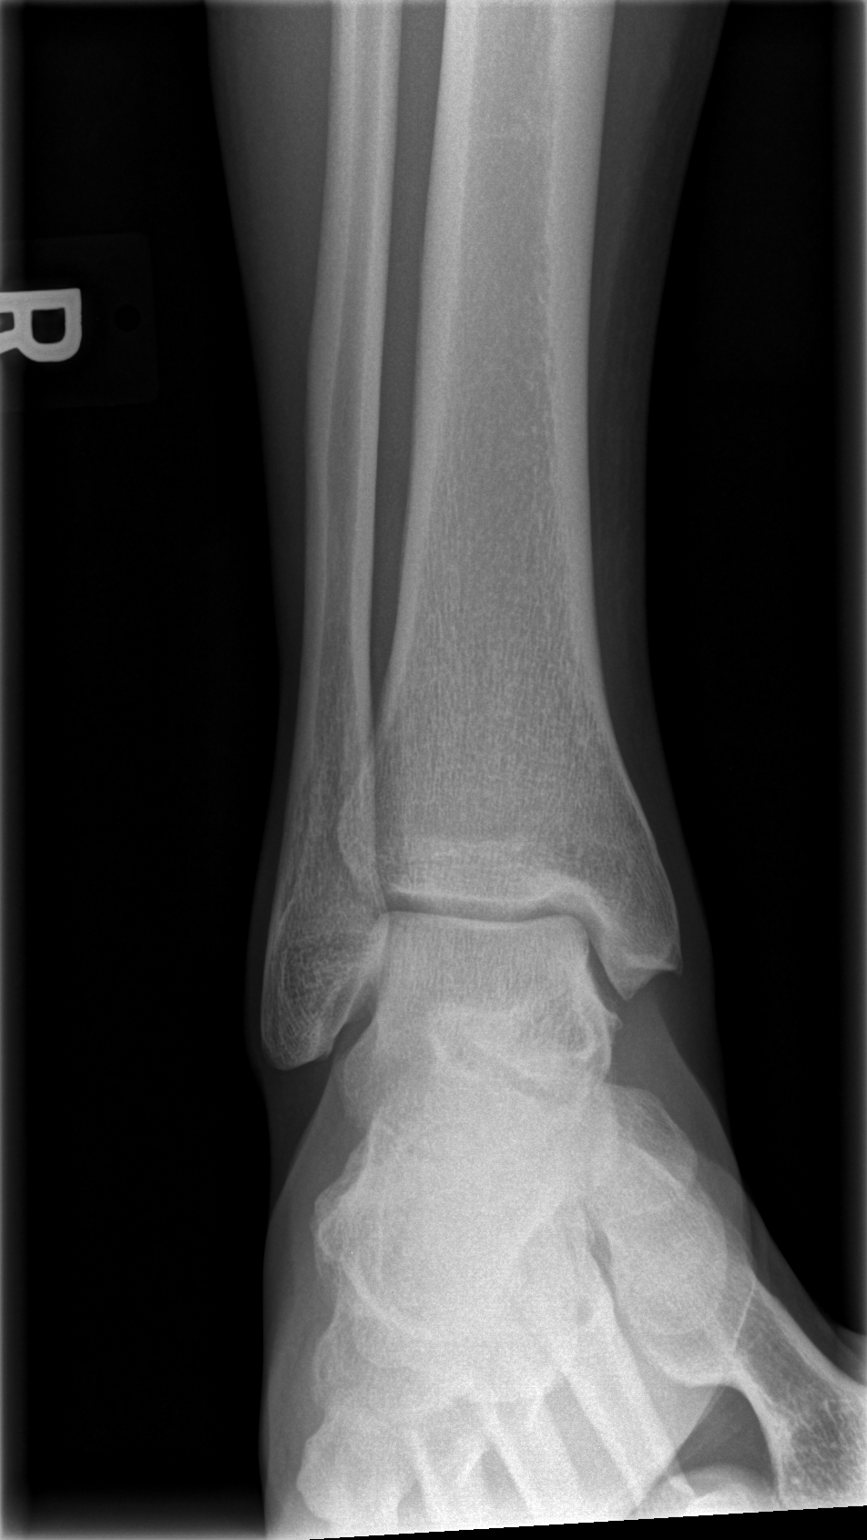

[t ankle joint lat right]
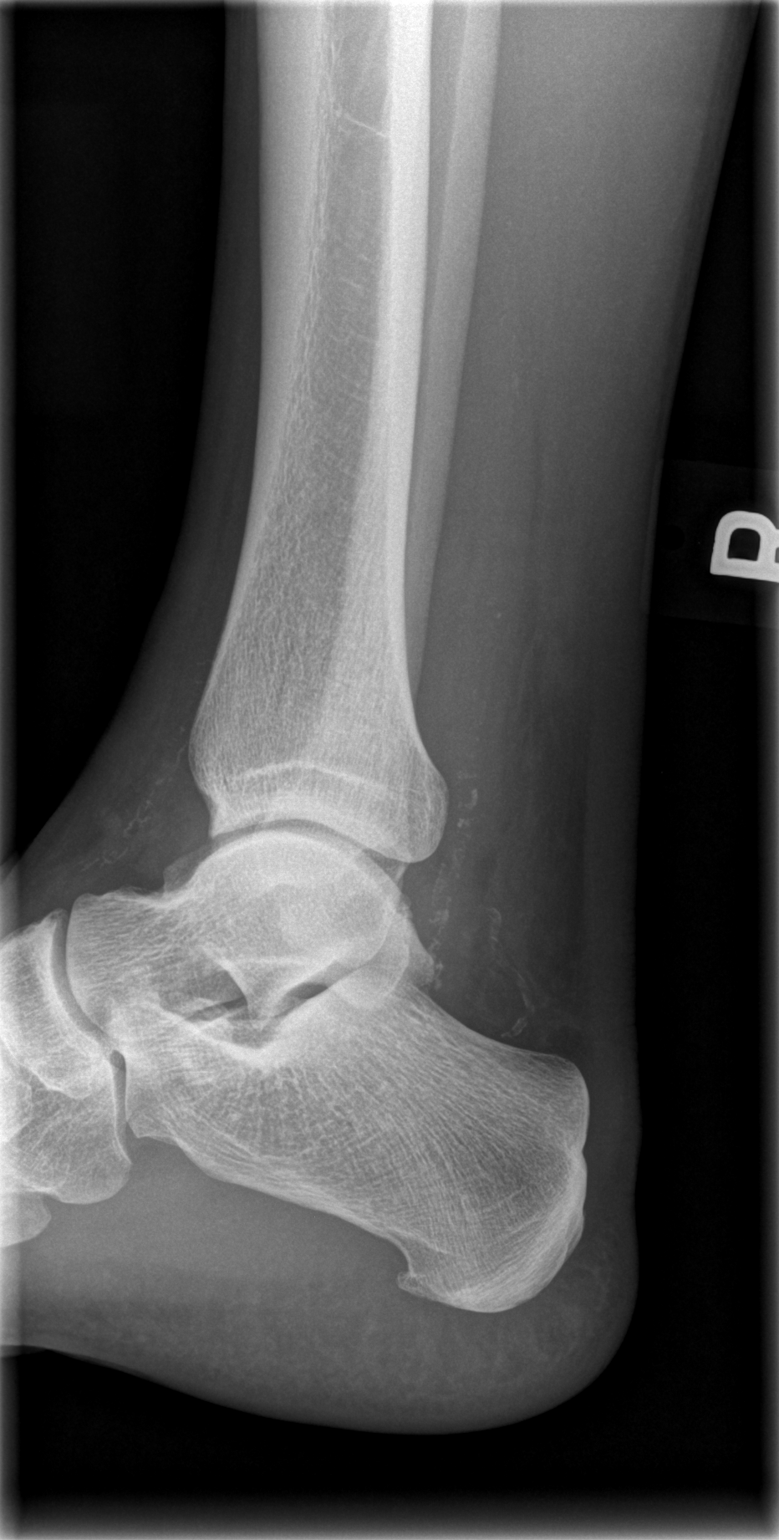

[3 of 3 positions shown; findings below may reference images not displayed]

FINDINGS: Three views of the right ankle submitted. No acute fracture or
subluxation. No radiopaque foreign body. Ankle mortise is preserved.
Small plantar spur of calcaneus.
IMPRESSION: No acute fracture or subluxation.  Small plantar spur of calcaneus.

## 2016-07-02 ENCOUNTER — Other Ambulatory Visit: Payer: Self-pay | Admitting: Internal Medicine

## 2016-07-09 ENCOUNTER — Telehealth: Payer: Self-pay | Admitting: Internal Medicine

## 2016-07-09 NOTE — Telephone Encounter (Signed)
Patient contacted and stated awareness 

## 2016-07-09 NOTE — Telephone Encounter (Signed)
Pt called stating his son just got dx with the flu, he was wondering if we can give him something to prevent the flu, pt has appt with Dr. Okey Duprerawford 07/10/2016 and he use [facility] on [address]. Please advise.

## 2016-07-09 NOTE — Telephone Encounter (Signed)
Best way to prevent flu is to wash hands thoroughly and often and call if symptoms develop within 48 hours to start treatment if needed.

## 2016-07-10 ENCOUNTER — Other Ambulatory Visit (INDEPENDENT_AMBULATORY_CARE_PROVIDER_SITE_OTHER): Payer: BC Managed Care – PPO

## 2016-07-10 ENCOUNTER — Encounter: Payer: Self-pay | Admitting: Internal Medicine

## 2016-07-10 ENCOUNTER — Ambulatory Visit (INDEPENDENT_AMBULATORY_CARE_PROVIDER_SITE_OTHER): Payer: BC Managed Care – PPO | Admitting: Internal Medicine

## 2016-07-10 VITALS — BP 138/82 | HR 52 | Temp 97.8°F | Resp 14 | Ht 68.0 in | Wt 173.5 lb

## 2016-07-10 DIAGNOSIS — I10 Essential (primary) hypertension: Secondary | ICD-10-CM

## 2016-07-10 DIAGNOSIS — Z23 Encounter for immunization: Secondary | ICD-10-CM

## 2016-07-10 DIAGNOSIS — K219 Gastro-esophageal reflux disease without esophagitis: Secondary | ICD-10-CM | POA: Diagnosis not present

## 2016-07-10 DIAGNOSIS — Z0000 Encounter for general adult medical examination without abnormal findings: Secondary | ICD-10-CM | POA: Diagnosis not present

## 2016-07-10 DIAGNOSIS — J4520 Mild intermittent asthma, uncomplicated: Secondary | ICD-10-CM

## 2016-07-10 LAB — COMPREHENSIVE METABOLIC PANEL
ALK PHOS: 84 U/L (ref 39–117)
ALT: 46 U/L (ref 0–53)
AST: 28 U/L (ref 0–37)
Albumin: 4.4 g/dL (ref 3.5–5.2)
BUN: 19 mg/dL (ref 6–23)
CHLORIDE: 104 meq/L (ref 96–112)
CO2: 31 mEq/L (ref 19–32)
Calcium: 9.6 mg/dL (ref 8.4–10.5)
Creatinine, Ser: 0.79 mg/dL (ref 0.40–1.50)
GFR: 111.29 mL/min (ref >60.00)
GLUCOSE: 99 mg/dL (ref 70–99)
POTASSIUM: 4.5 meq/L (ref 3.5–5.1)
Sodium: 139 mEq/L (ref 135–145)
TOTAL PROTEIN: 6.9 g/dL (ref 6.0–8.3)
Total Bilirubin: 0.9 mg/dL (ref 0.2–1.2)

## 2016-07-10 LAB — LIPID PANEL
Cholesterol: 189 mg/dL (ref 0–200)
HDL: 63.7 mg/dL (ref >39.00)
LDL CALC: 112 mg/dL — AB (ref 0–99)
NONHDL: 124.94
Total CHOL/HDL Ratio: 3
Triglycerides: 65 mg/dL (ref 0.0–149.0)
VLDL: 13 mg/dL (ref 0.0–40.0)

## 2016-07-10 LAB — CBC
HEMATOCRIT: 46.6 % (ref 39.0–52.0)
Hemoglobin: 16.8 g/dL (ref 13.0–17.0)
MCHC: 36 g/dL (ref 30.0–36.0)
MCV: 96.5 fl (ref 78.0–100.0)
Platelets: 169 10*3/uL (ref 150.0–400.0)
RBC: 4.83 Mil/uL (ref 4.22–5.81)
RDW: 13.3 % (ref 11.5–15.5)
WBC: 5.4 10*3/uL (ref 4.0–10.5)

## 2016-07-10 LAB — PSA: PSA: 0.79 ng/mL (ref 0.10–4.00)

## 2016-07-10 NOTE — Progress Notes (Signed)
   Subjective:    Patient ID: Cameron White, male    DOB: 02-17-1971, 46 y.o.   MRN: [id number]  HPI The patient is a 46 YO man coming in for wellness. No new concerns.   [facility], social history reviewed and updated.   Review of Systems  Constitutional: Negative.   HENT: Negative.   Eyes: Negative.   Respiratory: Negative for cough, chest tightness and shortness of breath.   Cardiovascular: Negative for chest pain, palpitations and leg swelling.  Gastrointestinal: Negative for abdominal distention, abdominal pain, constipation, diarrhea, nausea and vomiting.  Musculoskeletal: Positive for myalgias.  Skin: Negative.   Neurological: Negative.   Psychiatric/Behavioral: Negative.       Objective:   Physical Exam  Constitutional: He is oriented to person, place, and time. He appears well-developed and well-nourished.  HENT:  Head: Normocephalic and atraumatic.  Eyes: EOM are normal.  Neck: Normal range of motion.  Cardiovascular: Normal rate and regular rhythm.   Pulmonary/Chest: Effort normal and breath sounds normal. No respiratory distress. He has no wheezes. He has no rales.  Abdominal: Soft. Bowel sounds are normal. He exhibits no distension. There is no tenderness. There is no rebound.  Musculoskeletal: He exhibits no edema.  Neurological: He is alert and oriented to person, place, and time. Coordination normal.  Skin: Skin is warm and dry.  Psychiatric: He has a normal mood and affect.   Vitals:   07/10/16 0805  BP: 138/82  Pulse: (!) 52  Resp: 14  Temp: 97.8 F (36.6 C)  TempSrc: Oral  SpO2: 99%  Weight: 173 lb 8 oz (78.7 kg)  Height: 5\' 8"  (1.727 m)      Assessment & Plan:  Flu shot given at visit.

## 2016-07-10 NOTE — Assessment & Plan Note (Signed)
Checking labs, given flu shot today. Tetanus shot up to date. Declines HIV screening. Counseled about sun safety and dangers of distracted driving. Given screening recommendations. Due for colonoscopy at 48.

## 2016-07-10 NOTE — Progress Notes (Signed)
Pre visit review using our clinic review tool, if applicable. No additional management support is needed unless otherwise documented below in the visit note. 

## 2016-07-10 NOTE — Assessment & Plan Note (Signed)
Taking losartan 50 mg daily and checking CMP and adjust as needed. BP at goal.

## 2016-07-10 NOTE — Patient Instructions (Signed)
We have given you the flu shot and are checking the labs.   Health Maintenance, Male A healthy lifestyle and preventative care can promote health and wellness.  Maintain regular health, dental, and eye exams.  Eat a healthy diet. Foods like vegetables, fruits, whole grains, low-fat dairy products, and lean protein foods contain the nutrients you need and are low in calories. Decrease your intake of foods high in solid fats, added sugars, and salt. Get information about a proper diet from your health care provider, if necessary.  Regular physical exercise is one of the most important things you can do for your health. Most adults should get at least 150 minutes of moderate-intensity exercise (any activity that increases your heart rate and causes you to sweat) each week. In addition, most adults need muscle-strengthening exercises on 2 or more days a week.   Maintain a healthy weight. The body mass index (BMI) is a screening tool to identify possible weight problems. It provides an estimate of body fat based on height and weight. Your health care provider can find your BMI and can help you achieve or maintain a healthy weight. For males 20 years and older:  A BMI below 18.5 is considered underweight.  A BMI of 18.5 to 24.9 is normal.  A BMI of 25 to 29.9 is considered overweight.  A BMI of 30 and above is considered obese.  Maintain normal blood lipids and cholesterol by exercising and minimizing your intake of saturated fat. Eat a balanced diet with plenty of fruits and vegetables. Blood tests for lipids and cholesterol should begin at age 48 and be repeated every 5 years. If your lipid or cholesterol levels are high, you are over age 350, or you are at high risk for heart disease, you may need your cholesterol levels checked more frequently.Ongoing high lipid and cholesterol levels should be treated with medicines if diet and exercise are not working.  If you smoke, find out from your health  care provider how to quit. If you do not use tobacco, do not start.  Lung cancer screening is recommended for adults aged 55-80 years who are at high risk for developing lung cancer because of a history of smoking. A yearly low-dose CT scan of the lungs is recommended for people who have at least a 30-pack-year history of smoking and are current smokers or have quit within the past 15 years. A pack year of smoking is smoking an average of 1 pack of cigarettes a day for 1 year (for example, a 30-pack-year history of smoking could mean smoking 1 pack a day for 30 years or 2 packs a day for 15 years). Yearly screening should continue until the smoker has stopped smoking for at least 15 years. Yearly screening should be stopped for people who develop a health problem that would prevent them from having lung cancer treatment.  If you choose to drink alcohol, do not have more than 2 drinks per day. One drink is considered to be 12 oz (360 mL) of beer, 5 oz (150 mL) of wine, or 1.5 oz (45 mL) of liquor.  Avoid the use of street drugs. Do not share needles with anyone. Ask for help if you need support or instructions about stopping the use of drugs.  High blood pressure causes heart disease and increases the risk of stroke. High blood pressure is more likely to develop in:  People who have blood pressure in the end of the normal range (100-139/85-89 mm Hg).  People who are overweight or obese.  People who are African American.  If you are 63-15 years of age, have your blood pressure checked every 3-5 years. If you are 52 years of age or older, have your blood pressure checked every year. You should have your blood pressure measured twice-once when you are at a hospital or clinic, and once when you are not at a hospital or clinic. Record the average of the two measurements. To check your blood pressure when you are not at a hospital or clinic, you can use:  An automated blood pressure machine at a  pharmacy.  A home blood pressure monitor.  If you are 40-39 years old, ask your health care provider if you should take aspirin to prevent heart disease.  Diabetes screening involves taking a blood sample to check your fasting blood sugar level. This should be done once every 3 years after age 83 if you are at a normal weight and without risk factors for diabetes. Testing should be considered at a younger age or be carried out more frequently if you are overweight and have at least 1 risk factor for diabetes.  Colorectal cancer can be detected and often prevented. Most routine colorectal cancer screening begins at the age of 67 and continues through age 75. However, your health care provider may recommend screening at an earlier age if you have risk factors for colon cancer. On a yearly basis, your health care provider may provide home test kits to check for hidden blood in the stool. A small camera at the end of a tube may be used to directly examine the colon (sigmoidoscopy or colonoscopy) to detect the earliest forms of colorectal cancer. Talk to your health care provider about this at age 5 when routine screening begins. A direct exam of the colon should be repeated every 5-10 years through age 38, unless early forms of precancerous polyps or small growths are found.  People who are at an increased risk for hepatitis B should be screened for this virus. You are considered at high risk for hepatitis B if:  You were born in a country where hepatitis B occurs often. Talk with your health care provider about which countries are considered high risk.  Your parents were born in a high-risk country and you have not received a shot to protect against hepatitis B (hepatitis B vaccine).  You have HIV or AIDS.  You use needles to inject street drugs.  You live with, or have sex with, someone who has hepatitis B.  You are a man who has sex with other men (MSM).  You get hemodialysis  treatment.  You take certain medicines for conditions like cancer, organ transplantation, and autoimmune conditions.  Hepatitis C blood testing is recommended for all people born from [birth year] through 1967 and any individual with known risk factors for hepatitis C.  Healthy men should no longer receive prostate-specific antigen (PSA) blood tests as part of routine cancer screening. Talk to your health care provider about prostate cancer screening.  Testicular cancer screening is not recommended for adolescents or adult males who have no symptoms. Screening includes self-exam, a health care provider exam, and other screening tests. Consult with your health care provider about any symptoms you have or any concerns you have about testicular cancer.  Practice safe sex. Use condoms and avoid high-risk sexual practices to reduce the spread of sexually transmitted infections (STIs).  You should be screened for STIs, including gonorrhea and chlamydia if:  You are sexually active and are younger than 24 years.  You are older than 24 years, and your health care provider tells you that you are at risk for this type of infection.  Your sexual activity has changed since you were last screened, and you are at an increased risk for chlamydia or gonorrhea. Ask your health care provider if you are at risk.  If you are at risk of being infected with HIV, it is recommended that you take a prescription medicine daily to prevent HIV infection. This is called pre-exposure prophylaxis (PrEP). You are considered at risk if:  You are a man who has sex with other men (MSM).  You are a heterosexual man who is sexually active with multiple partners.  You take drugs by injection.  You are sexually active with a partner who has HIV.  Talk with your health care provider about whether you are at high risk of being infected with HIV. If you choose to begin PrEP, you should first be tested for HIV. You should then be tested  every 3 months for as long as you are taking PrEP.  Use sunscreen. Apply sunscreen liberally and repeatedly throughout the day. You should seek shade when your shadow is shorter than you. Protect yourself by wearing long sleeves, pants, a wide-brimmed hat, and sunglasses year round whenever you are outdoors.  Tell your health care provider of new moles or changes in moles, especially if there is a change in shape or color. Also, tell your health care provider if a mole is larger than the size of a pencil eraser.  A one-time screening for abdominal aortic aneurysm (AAA) and surgical repair of large AAAs by ultrasound is recommended for men aged 71-75 years who are current or former smokers.  Stay current with your vaccines (immunizations). This information is not intended to replace advice given to you by your health care provider. Make sure you discuss any questions you have with your health care provider. Document Released: 11/24/2007 Document Revised: 06/18/2014 Document Reviewed: 03/01/2015 Elsevier Interactive Patient Education  2017 [employer].

## 2016-07-10 NOTE — Assessment & Plan Note (Signed)
Taking symbicort prn only and albuterol prn. Not using either lately and no flare.

## 2016-07-10 NOTE — Assessment & Plan Note (Signed)
Protonix gives good control.

## 2016-08-03 ENCOUNTER — Other Ambulatory Visit: Payer: Self-pay | Admitting: Internal Medicine

## 2016-08-09 ENCOUNTER — Ambulatory Visit (INDEPENDENT_AMBULATORY_CARE_PROVIDER_SITE_OTHER): Payer: BC Managed Care – PPO | Admitting: Sports Medicine

## 2016-08-09 ENCOUNTER — Encounter: Payer: Self-pay | Admitting: Sports Medicine

## 2016-08-09 DIAGNOSIS — M25531 Pain in right wrist: Secondary | ICD-10-CM | POA: Insufficient documentation

## 2016-08-09 DIAGNOSIS — M25519 Pain in unspecified shoulder: Secondary | ICD-10-CM | POA: Insufficient documentation

## 2016-08-09 NOTE — Assessment & Plan Note (Signed)
Pain worse in right shoulder likely related to load bearing in yoga and tennis. Pain appears to be rotator cuff related - given exercises  - NSAIDs prn - will follow

## 2016-08-09 NOTE — Assessment & Plan Note (Signed)
Pain associated with the right tfcc likely from loading stress with ypa as well as tennis - will abstain from loading exercises as tolerated, especially yoga - voltaren gel  - will add systemic anti inflammatory medications of no relief from voltaren gel

## 2016-08-09 NOTE — Assessment & Plan Note (Signed)
Continue current management. NSAIDs as needed

## 2016-08-09 NOTE — Progress Notes (Signed)
   Subjective:    Patient ID: Cameron White, male    DOB: [birth year], 46 y.o.   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 He presents for right wrist pain, bilateral shoulder pain, bilateral knee pain  Right wrist pain - started one month ago - of note he started yoga for a month prior to the onset of his wrist pain and stopped right before the pain started - he is also playing tennis three times per week.  - he notes the pain is worse with serving forward strokes - he has taken motrin once, but overall does not take medication for this - he denies swelling, reduced range of motion, weakness or elbow pain  Shoulder pain -  Has been ongoing - hurts most under his arms - no reduced range of motion, weakness or swelling  Right Knee pain - chronic in nature, and he does not feel it is worsening - he is an active runner and feels is running is impacted after he has been running or playing tennis a lot - the pain is anterior in nature, no weakness or swelling - he has been told previously that he has bone spurs in bilateral knees  Pertinent past medical history: Chronic right knee pain  Review of Systems  Per HPI   Objective:  BP 136/87   Pulse (!) 59   Ht 5\' 8"  (1.727 m)   Wt 165 lb (74.8 kg)   BMI 25.09 kg/m  Vitals and nursing note reviewed  General: NAD MSK:  Right wrist - normal appearing wrist with no visible swelling or effusion - pain on palpation just distal to the distal ulna - pain elicited with hyper extension of the wrist - pain with lift test -Normal range of motion of the wrist and normal strength  Right Knee - midline bipartite patella noted - no tenderness to palpation - normal ROM - no swelling or effusion noted  Shoulder - no swelling or effusions noted on inspection - normal ROM - no tenderness to palpation - negative empty can  test  US  Limited bed side US of the left knee showed spurring at the proximal patella with fluid adjacent. Bipartite patella noted   Assessment & Plan:    Knee pain, right Continue current management. NSAIDs as needed  Right wrist pain Pain associated with the right tfcc likely from loading stress with ypa as well as tennis - will abstain from loading exercises as tolerated, especially yoga - voltaren gel  - will add systemic anti inflammatory medications of no relief from voltaren gel  Pain in joint, shoulder region Pain worse in right shoulder likely related to load bearing in yoga and tennis. Pain appears to be rotator cuff related - given exercises  - NSAIDs prn - will follow    Aurthur Wingerter A. Kennon RoundsHaney MD, MS Family Medicine Resident PGY-3 Pager [phone]  Patient seen and evaluated with the resident. I agree with the above plan of care. Patient's right wrist pain is likely secondary to a TFCC injury. He'll try topical Voltaren and if that is ineffective we will put him on an oral anti-inflammatory. He will avoid weightbearing exercises on the right hand. His bilateral shoulder and right knee pain are chronic. He is well educated in home exercises for both of these areas. I simply encouraged him to continue with these. Follow-up as needed.

## 2016-09-02 ENCOUNTER — Other Ambulatory Visit: Payer: Self-pay | Admitting: Internal Medicine

## 2016-09-17 ENCOUNTER — Other Ambulatory Visit: Payer: Self-pay | Admitting: Internal Medicine

## 2016-11-27 ENCOUNTER — Ambulatory Visit
Admission: RE | Admit: 2016-11-27 | Discharge: 2016-11-27 | Disposition: A | Discharge status: Home / Self Care | Payer: BC Managed Care – PPO | Source: Ambulatory Visit | Attending: Sports Medicine | Admitting: Sports Medicine

## 2016-11-27 ENCOUNTER — Ambulatory Visit (INDEPENDENT_AMBULATORY_CARE_PROVIDER_SITE_OTHER): Payer: BC Managed Care – PPO | Admitting: Sports Medicine

## 2016-11-27 ENCOUNTER — Encounter: Payer: Self-pay | Admitting: Sports Medicine

## 2016-11-27 VITALS — BP 114/80 | Ht 68.0 in | Wt 165.0 lb

## 2016-11-27 DIAGNOSIS — M25561 Pain in right knee: Secondary | ICD-10-CM

## 2016-11-27 MED ORDER — MELOXICAM 15 MG PO TABS: 15.0000 mg | ORAL_TABLET | Freq: Every day | ORAL | 0 refills | Status: AC

## 2016-11-28 NOTE — Progress Notes (Signed)
   Subjective:    Patient ID: Cameron White, male    DOB: Aug 07, 1970, 46 y.o.   MRN: [id number]  HPI chief complaint: Right knee pain  46 year old male with a known history of calcific quadriceps tendinopathy comes in today with reactivation of his condition. He reinjured the knee 4 days ago. While playing tennis he noticed the knee was sore. He then went water skiing afterwards. No significant pain with activity but began to experience significant discomfort afterwards. Localizes his pain to the superior patella. Denies pain elsewhere in the knee. He has not noticed any swelling. No mechanical symptoms. Symptoms are identical in nature to what he has experienced previously. He previously used nitroglycerin patches but unfortunately developed a rash from this. He's been using a Cho-Pat strap but it has not been beneficial.  Interim medical history reviewed Medications reviewed Allergies reviewed    Review of Systems    as above Objective:   Physical Exam  Well-developed, well-nourished. No acute distress  Right knee: Full range of motion. No obvious effusion. He is tender to palpation directly over the superiormost aspect of the patella. No soft tissue swelling. Extensor mechanism is intact. No joint line tenderness. Good ligament stability. There is prominence of the inferior pole of the patella as well but it is nontender to palpation. Neurovascularly intact distally. Walking without a limp.  Limited MSK ultrasound shows persistent distal calcific tendinopathy of the quadriceps tendon with edema. Trace effusion is seen as well. X-rays of his right knee also shows prominent osteophytes off of both the anterior and inferior patella. As seen on ultrasound, he has a small effusion.      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 He will try meloxicam 15 mg daily for 5 days then as needed. Start eccentric exercises and wear compression sleeve with activity. We briefly discussed the possibility of surgical intervention but he is not yet ready to pursue this. Follow-up for ongoing or recalcitrant issues.

## 2016-12-04 ENCOUNTER — Other Ambulatory Visit: Payer: Self-pay | Admitting: Internal Medicine

## 2016-12-18 ENCOUNTER — Encounter: Payer: Self-pay | Admitting: Sports Medicine

## 2016-12-18 ENCOUNTER — Ambulatory Visit (INDEPENDENT_AMBULATORY_CARE_PROVIDER_SITE_OTHER): Payer: BC Managed Care – PPO | Admitting: Sports Medicine

## 2016-12-18 DIAGNOSIS — M2021 Hallux rigidus, right foot: Secondary | ICD-10-CM

## 2016-12-18 NOTE — Assessment & Plan Note (Signed)
Patient was fitted for a : standard, cushioned, semi-rigid orthotic. The orthotic was heated and afterward the patient stood on the orthotic blank positioned on the orthotic stand. The patient was positioned in subtalar neutral position and 10 degrees of ankle dorsiflexion in a weight bearing stance. After completion of molding, a stable base was applied to the orthotic blank. The blank was ground to a stable position for weight bearing. Size: 9 Red EVA Base: med. Density blue EVA Posting: none Additional orthotic padding: none  Running gait looked good and felt comfortable post orthtoics  I spent 30 minutes with this patient. Over 50% of visit was spend in counseling and coordination of care for problems with foot and lower leg pain during sports.

## 2016-12-18 NOTE — Progress Notes (Signed)
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

## 2017-01-04 ENCOUNTER — Other Ambulatory Visit: Payer: Self-pay | Admitting: Internal Medicine

## 2017-03-11 HISTORY — PX: OTHER SURGICAL HISTORY: SHX169

## 2017-04-03 DIAGNOSIS — Z9889 Other specified postprocedural states: Secondary | ICD-10-CM

## 2017-04-03 HISTORY — DX: Other specified postprocedural states: Z98.890

## 2017-04-08 ENCOUNTER — Telehealth: Payer: Self-pay | Admitting: Internal Medicine

## 2017-04-08 MED ORDER — PANTOPRAZOLE SODIUM 40 MG PO TBEC: DELAYED_RELEASE_TABLET | ORAL | 0 refills | Status: DC

## 2017-04-08 NOTE — Telephone Encounter (Signed)
Sent 90 day script to [facility] until annual appt in Jan.../lmb

## 2017-04-08 NOTE — Telephone Encounter (Signed)
Patient requesting refill on pantoprazole to be sent to [facility] at [address].

## 2017-05-31 ENCOUNTER — Encounter: Payer: Self-pay | Admitting: Internal Medicine

## 2017-05-31 ENCOUNTER — Other Ambulatory Visit: Payer: Self-pay

## 2017-05-31 ENCOUNTER — Ambulatory Visit (INDEPENDENT_AMBULATORY_CARE_PROVIDER_SITE_OTHER): Payer: BC Managed Care – PPO | Admitting: Internal Medicine

## 2017-05-31 MED ORDER — FLUTICASONE PROPIONATE 50 MCG/ACT NA SUSP: 2.0000 | Freq: Every day | NASAL | 6 refills | Status: DC

## 2017-05-31 NOTE — Progress Notes (Signed)
ERROR

## 2017-06-20 ENCOUNTER — Other Ambulatory Visit: Payer: Self-pay | Admitting: Internal Medicine

## 2017-06-21 ENCOUNTER — Other Ambulatory Visit: Payer: Self-pay

## 2017-06-21 MED ORDER — LOSARTAN POTASSIUM 50 MG PO TABS: ORAL_TABLET | ORAL | 1 refills | Status: DC

## 2017-07-11 ENCOUNTER — Encounter: Payer: Self-pay | Admitting: Internal Medicine

## 2017-07-11 ENCOUNTER — Ambulatory Visit (INDEPENDENT_AMBULATORY_CARE_PROVIDER_SITE_OTHER)
Admission: RE | Admit: 2017-07-11 | Discharge: 2017-07-11 | Disposition: A | Discharge status: Home / Self Care | Payer: BC Managed Care – PPO | Source: Ambulatory Visit | Attending: Internal Medicine | Admitting: Internal Medicine

## 2017-07-11 ENCOUNTER — Other Ambulatory Visit (INDEPENDENT_AMBULATORY_CARE_PROVIDER_SITE_OTHER): Payer: BC Managed Care – PPO

## 2017-07-11 ENCOUNTER — Ambulatory Visit (INDEPENDENT_AMBULATORY_CARE_PROVIDER_SITE_OTHER): Payer: BC Managed Care – PPO | Admitting: Internal Medicine

## 2017-07-11 VITALS — BP 140/86 | HR 52 | Temp 98.3°F | Ht 68.0 in | Wt 177.0 lb

## 2017-07-11 DIAGNOSIS — I10 Essential (primary) hypertension: Secondary | ICD-10-CM | POA: Diagnosis not present

## 2017-07-11 DIAGNOSIS — J4520 Mild intermittent asthma, uncomplicated: Secondary | ICD-10-CM | POA: Diagnosis not present

## 2017-07-11 DIAGNOSIS — Z0000 Encounter for general adult medical examination without abnormal findings: Secondary | ICD-10-CM

## 2017-07-11 DIAGNOSIS — Z0001 Encounter for general adult medical examination with abnormal findings: Secondary | ICD-10-CM

## 2017-07-11 DIAGNOSIS — K219 Gastro-esophageal reflux disease without esophagitis: Secondary | ICD-10-CM | POA: Diagnosis not present

## 2017-07-11 DIAGNOSIS — R05 Cough: Secondary | ICD-10-CM | POA: Diagnosis not present

## 2017-07-11 DIAGNOSIS — R059 Cough, unspecified: Secondary | ICD-10-CM

## 2017-07-11 LAB — COMPREHENSIVE METABOLIC PANEL
ALK PHOS: 83 U/L (ref 39–117)
ALT: 44 U/L (ref 0–53)
AST: 27 U/L (ref 0–37)
Albumin: 4.2 g/dL (ref 3.5–5.2)
BUN: 14 mg/dL (ref 6–23)
CHLORIDE: 103 meq/L (ref 96–112)
CO2: 29 mEq/L (ref 19–32)
Calcium: 9.3 mg/dL (ref 8.4–10.5)
Creatinine, Ser: 0.83 mg/dL (ref 0.40–1.50)
GFR: 104.69 mL/min (ref >60.00)
GLUCOSE: 109 mg/dL — AB (ref 70–99)
POTASSIUM: 4.3 meq/L (ref 3.5–5.1)
SODIUM: 138 meq/L (ref 135–145)
TOTAL PROTEIN: 7 g/dL (ref 6.0–8.3)
Total Bilirubin: 0.8 mg/dL (ref 0.2–1.2)

## 2017-07-11 LAB — CBC
HEMATOCRIT: 45.3 % (ref 39.0–52.0)
Hemoglobin: 16.3 g/dL (ref 13.0–17.0)
MCHC: 35.9 g/dL (ref 30.0–36.0)
MCV: 97.1 fl (ref 78.0–100.0)
Platelets: 173 10*3/uL (ref 150.0–400.0)
RBC: 4.67 Mil/uL (ref 4.22–5.81)
RDW: 13.3 % (ref 11.5–15.5)
WBC: 5.8 10*3/uL (ref 4.0–10.5)

## 2017-07-11 LAB — LIPID PANEL
CHOL/HDL RATIO: 3
Cholesterol: 168 mg/dL (ref 0–200)
HDL: 60.4 mg/dL (ref >39.00)
LDL CALC: 96 mg/dL (ref 0–99)
NONHDL: 108.08
Triglycerides: 58 mg/dL (ref 0.0–149.0)
VLDL: 11.6 mg/dL (ref 0.0–40.0)

## 2017-07-11 LAB — HEMOGLOBIN A1C: HEMOGLOBIN A1C: 5 % (ref 4.6–6.5)

## 2017-07-11 MED ORDER — ALBUTEROL SULFATE HFA 108 (90 BASE) MCG/ACT IN AERS: 2.0000 | INHALATION_SPRAY | RESPIRATORY_TRACT | 2 refills | Status: DC | PRN

## 2017-07-11 MED ORDER — BUDESONIDE-FORMOTEROL FUMARATE 160-4.5 MCG/ACT IN AERO: 2.0000 | INHALATION_SPRAY | Freq: Two times a day (BID) | RESPIRATORY_TRACT | 5 refills | Status: DC

## 2017-07-11 MED ORDER — PANTOPRAZOLE SODIUM 40 MG PO TBEC: DELAYED_RELEASE_TABLET | ORAL | 3 refills | Status: DC

## 2017-07-11 NOTE — Assessment & Plan Note (Signed)
Taking protonix daily and some increase in symptoms with coughing after meals. Checking CXR to rule out lung findings. Advised to add zantac or pepcid in the evening.

## 2017-07-11 NOTE — Assessment & Plan Note (Signed)
Uses albuterol prn and symbicort prn. Has not used either in >1 year.

## 2017-07-11 NOTE — Progress Notes (Signed)
   Subjective:    Patient ID: Cameron White, male    DOB: 07-21-1970, 47 y.o.   MRN: [id number]  HPI The patient is a  47 YO male coming in for physical. No new concerns.  [facility] reviewed and updated.   Review of Systems  Constitutional: Negative.   HENT: Negative.   Eyes: Negative.   Respiratory: Positive for cough. Negative for chest tightness and shortness of breath.   Cardiovascular: Negative for chest pain, palpitations and leg swelling.  Gastrointestinal: Negative for abdominal distention, abdominal pain, constipation, diarrhea, nausea and vomiting.  Musculoskeletal: Negative.   Skin: Negative.   Neurological: Negative.   Psychiatric/Behavioral: Negative.       Objective:   Physical Exam  Constitutional: He is oriented to person, place, and time. He appears well-developed and well-nourished.  HENT:  Head: Normocephalic and atraumatic.  Eyes: EOM are normal.  Neck: Normal range of motion.  Cardiovascular: Normal rate and regular rhythm.  Pulmonary/Chest: Effort normal and breath sounds normal. No respiratory distress. He has no wheezes. He has no rales.  Abdominal: Soft. Bowel sounds are normal. He exhibits no distension. There is no tenderness. There is no rebound.  Musculoskeletal: He exhibits no edema.  Neurological: He is alert and oriented to person, place, and time. Coordination normal.  Skin: Skin is warm and dry.  Psychiatric: He has a normal mood and affect.   Vitals:   07/11/17 0800  BP: 140/86  Pulse: (!) 52  Temp: 98.3 F (36.8 C)  TempSrc: Oral  SpO2: 99%  Weight: 177 lb (80.3 kg)  Height: 5\' 8"  (1.727 m)      Assessment & Plan:

## 2017-07-11 NOTE — Assessment & Plan Note (Signed)
Taking losartan 50 mg daily and checking CMP and adjust as needed.  

## 2017-07-11 NOTE — Patient Instructions (Addendum)
We will check the chest x-ray today and call you back with the results.  We are checking the labs and have sent in refills.   Add zantac (150 mg at night) or pepcid (30 mg at night time) to add on to the protonix that you are taking.    Health Maintenance, Male A healthy lifestyle and preventive care is important for your health and wellness. Ask your health care provider about what schedule of regular examinations is right for you. What should I know about weight and diet? Eat a Healthy Diet  Eat plenty of vegetables, fruits, whole grains, low-fat dairy products, and lean protein.  Do not eat a lot of foods high in solid fats, added sugars, or salt.  Maintain a Healthy Weight Regular exercise can help you achieve or maintain a healthy weight. You should:  Do at least 150 minutes of exercise each week. The exercise should increase your heart rate and make you sweat (moderate-intensity exercise).  Do strength-training exercises at least twice a week.  Watch Your Levels of Cholesterol and Blood Lipids  Have your blood tested for lipids and cholesterol every 5 years starting at 49 years of age. If you are at high risk for heart disease, you should start having your blood tested when you are 49 years old. You may need to have your cholesterol levels checked more often if: ? Your lipid or cholesterol levels are high. ? You are older than 49 years of age. ? You are at high risk for heart disease.  What should I know about cancer screening? Many types of cancers can be detected early and may often be prevented. Lung Cancer  You should be screened every year for lung cancer if: ? You are a current smoker who has smoked for at least 30 years. ? You are a former smoker who has quit within the past 15 years.  Talk to your health care provider about your screening options, when you should start screening, and how often you should be screened.  Colorectal Cancer  Routine colorectal  cancer screening usually begins at 49 years of age and should be repeated every 5-10 years until you are 49 years old. You may need to be screened more often if early forms of precancerous polyps or small growths are found. Your health care provider may recommend screening at an earlier age if you have risk factors for colon cancer.  Your health care provider may recommend using home test kits to check for hidden blood in the stool.  A small camera at the end of a tube can be used to examine your colon (sigmoidoscopy or colonoscopy). This checks for the earliest forms of colorectal cancer.  Prostate and Testicular Cancer  Depending on your age and overall health, your health care provider may do certain tests to screen for prostate and testicular cancer.  Talk to your health care provider about any symptoms or concerns you have about testicular or prostate cancer.  Skin Cancer  Check your skin from head to toe regularly.  Tell your health care provider about any new moles or changes in moles, especially if: ? There is a change in a mole's size, shape, or color. ? You have a mole that is larger than a pencil eraser.  Always use sunscreen. Apply sunscreen liberally and repeat throughout the day.  Protect yourself by wearing long sleeves, pants, a wide-brimmed hat, and sunglasses when outside.  What should I know about heart disease, diabetes, and high  blood pressure?  If you are 4018-49 years of age, have your blood pressure checked every 3-5 years. If you are 49 years of age or older, have your blood pressure checked every year. You should have your blood pressure measured twice-once when you are at a hospital or clinic, and once when you are not at a hospital or clinic. Record the average of the two measurements. To check your blood pressure when you are not at a hospital or clinic, you can use: ? An automated blood pressure machine at a pharmacy. ? A home blood pressure monitor.  Talk to  your health care provider about your target blood pressure.  If you are between 5745-49 years old, ask your health care provider if you should take aspirin to prevent heart disease.  Have regular diabetes screenings by checking your fasting blood sugar level. ? If you are at a normal weight and have a low risk for diabetes, have this test once every three years after the age of 49. ? If you are overweight and have a high risk for diabetes, consider being tested at a younger age or more often.  A one-time screening for abdominal aortic aneurysm (AAA) by ultrasound is recommended for men aged 65-75 years who are current or former smokers. What should I know about preventing infection? Hepatitis B If you have a higher risk for hepatitis B, you should be screened for this virus. Talk with your health care provider to find out if you are at risk for hepatitis B infection. Hepatitis C Blood testing is recommended for:  Everyone born from [id number] through 1967.  Anyone with known risk factors for hepatitis C.  Sexually Transmitted Diseases (STDs)  You should be screened each year for STDs including gonorrhea and chlamydia if: ? You are sexually active and are younger than 49 years of age. ? You are older than 49 years of age and your health care provider tells you that you are at risk for this type of infection. ? Your sexual activity has changed since you were last screened and you are at an increased risk for chlamydia or gonorrhea. Ask your health care provider if you are at risk.  Talk with your health care provider about whether you are at high risk of being infected with HIV. Your health care provider may recommend a prescription medicine to help prevent HIV infection.  What else can I do?  Schedule regular health, dental, and eye exams.  Stay current with your vaccines (immunizations).  Do not use any tobacco products, such as cigarettes, chewing tobacco, and e-cigarettes. If you need  help quitting, ask your health care provider.  Limit alcohol intake to no more than 2 drinks per day. One drink equals 12 ounces of beer, 5 ounces of wine, or 1 ounces of hard liquor.  Do not use street drugs.  Do not share needles.  Ask your health care provider for help if you need support or information about quitting drugs.  Tell your health care provider if you often feel depressed.  Tell your health care provider if you have ever been abused or do not feel safe at home. This information is not intended to replace advice given to you by your health care provider. Make sure you discuss any questions you have with your health care provider. Document Released: 11/24/2007 Document Revised: 01/25/2016 Document Reviewed: 03/01/2015 Elsevier Interactive Patient Education  [employer]2018 [employer]

## 2017-07-11 NOTE — Assessment & Plan Note (Signed)
Declines HIV screening. Flu and tetanus up to date. Colon screening due at 50.

## 2017-07-18 ENCOUNTER — Encounter: Payer: Self-pay | Admitting: Sports Medicine

## 2017-07-18 ENCOUNTER — Ambulatory Visit: Payer: Self-pay

## 2017-07-18 ENCOUNTER — Ambulatory Visit: Payer: BC Managed Care – PPO | Admitting: Sports Medicine

## 2017-07-18 VITALS — BP 132/88 | Ht 68.0 in | Wt 170.0 lb

## 2017-07-18 DIAGNOSIS — M25561 Pain in right knee: Secondary | ICD-10-CM

## 2017-07-18 DIAGNOSIS — M76899 Other specified enthesopathies of unspecified lower limb, excluding foot: Secondary | ICD-10-CM | POA: Insufficient documentation

## 2017-07-18 NOTE — Assessment & Plan Note (Signed)
I think the tendons are healed well but still tight after surgery  I want him to modify his running to shorter bouts 2 min run plus 1 min walk Cameron SicilianXtrain Icing May want to try patellar straps  Most pain now is at patellar and not quad tendons

## 2017-07-18 NOTE — Progress Notes (Signed)
   Subjective:    Patient ID: Cameron White, male    DOB: 02/02/1971, 47 y.o.   MRN: [id number]  HPI Cameron White is a 47 year old male who presents for bilateral knee pain, right>left. He is status post bilateral knee bone spur removal October 2018. Since then he has been doing rehab and swimming/biking. This past week he began to return to running, increasing distance by 400 meters per day. He began having knee pain after running 1200 meters continuously yesterday. He ran a mile this morning and continued to have knee pain. His pain is worst inferior to his patella, but also has some pain around the patella. He has not tried any treatment for this yet. Denies any knee swelling or erythema   Review of Systems Negative except as stated above No giving way of knee No new swelling    Objective:   Physical Exam General: Well appearing, no acute distress BP 132/88   Ht 5\' 8"  (1.727 m)   Wt 170 lb (77.1 kg)   BMI 25.85 kg/m   Right knee: No edema or erythema, full ROM. Tenderness to palpation of patellar tendon, worst at insertion at tibial tuberosity. No pain with translation of patella. No joint line tenderness. Full strength and sensation.  Left knee: No edema or erythema, full ROM. Mild tenderness to palpation of patellar tendon. No pain with translation of patella. No joint line tenderness. Full strength and sensation.  Bedside US performed, limited images of bilateral knees obtained. No effusion or soft tissue edema. Multiple spurs present in proximal and distal quad and patellar tendons. Most spurs appear different compared to previous imaging before surgery.      Assessment & Plan:  47 year old male status post bilateral knee bone spur removal presents for worsening R>L knee pain after starting running this week. Exam consistent with patellar tendon irritation. US reveals presence of bone spurs in bilateral knees, improved since surgery. Suspect patellar tendon as the source of pain.  Tried running in the office with patellar strap, patient reports minimal improvement. Counseled the patient to reduce running based on pain level. Patient will ice and may take meloxicam at home as needed. He may also run with patellar strap. Follow up as needed.

## 2017-11-14 ENCOUNTER — Ambulatory Visit: Payer: 59 | Admitting: Sports Medicine

## 2017-11-14 VITALS — BP 140/82 | Ht 68.0 in | Wt 172.0 lb

## 2017-11-14 DIAGNOSIS — M25551 Pain in right hip: Secondary | ICD-10-CM | POA: Diagnosis not present

## 2017-11-14 NOTE — Progress Notes (Signed)
   Subjective:    Patient ID: Cameron White, male    DOB: [birth year], 47 y.o.   MRN: [id number]  HPI 1.5 wks ago pt went for a run Saturday morning and then played 2 hours of hard doubles tennis in the evening. The next day he noticed some pain in his R posterior hip that has persisted even at rest. This is his primary concern today. He took Sunday off and ran 1.5 miles Monday and had to stop early and walk home due to R posterior hip pain. He took 3d off and ran 3 miles last Sunday and still had pain there and has not run since. It felt tolerable but the pain was still noticeable. He had surgery in October that involved splitting the quadriceps tendons B and "cleaning up" his R knee. He had been doing fine but mentions he is no longer doing his rehab exercises aside from some quad extensions and is now having some tightness in B knees. He particularly notices this after playing tennis. He'll talk for about 30 minutes and this his knees feel very tight going downstairs to get to the car. Is is still icing at times but does not stretch after activity. No meds taken. He lifts weights twice a week.    Review of Systems As above    Objective:   Physical Exam  Seated on exam table in NAD R posterior hip/low back laterally looks WNL. This area is reproducibly TTP over the lateral obliques. NL ROM but trunk twisting motions elicit more discomfort. Excellent hip abductor strength but positive trenedelenberg NVI.  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 Counseled that at  least some of these exercises will need to become part of his basic health maintenance plan. Ice after exercise. F/u prn.

## 2017-12-19 ENCOUNTER — Other Ambulatory Visit: Payer: Self-pay | Admitting: Internal Medicine

## 2018-02-11 ENCOUNTER — Encounter: Payer: Self-pay | Admitting: Internal Medicine

## 2018-02-11 ENCOUNTER — Ambulatory Visit: Payer: 59 | Admitting: Internal Medicine

## 2018-02-11 VITALS — BP 118/78 | HR 52 | Temp 97.8°F | Ht 68.0 in | Wt 174.0 lb

## 2018-02-11 DIAGNOSIS — M545 Low back pain, unspecified: Secondary | ICD-10-CM

## 2018-02-11 DIAGNOSIS — Z23 Encounter for immunization: Secondary | ICD-10-CM | POA: Diagnosis not present

## 2018-02-11 NOTE — Patient Instructions (Signed)

## 2018-02-11 NOTE — Assessment & Plan Note (Signed)
Likely muscular on exam. Given reassurance and advised to use nsaids and heat. If no improvement in 1-2 weeks or worsening will call back. Declines muscle relaxer needed. Imaging is not appropriate at this time. Given back exercises to do to help with pain and prevention of recurrence.

## 2018-02-11 NOTE — Progress Notes (Signed)
   Subjective:    Patient ID: Cameron White, male    DOB: 1970-06-28, 47 y.o.   MRN: [id number]  HPI The patient is a 47 YO man coming in for possible pulled muscle in his back. Happened about 1-2 weeks ago and was feeling much better. He was able to run last Friday without pain. Then was standing watching soccer on Saturday and was hurting afterwards. He then played a decent amount of tennis on Sunday which also caused his back to hurt a decent amount on Monday and is still hurting today. He took some medication for it over the weekend which helped. He denies pain in his legs. No change in bowel or bladder function. Denies numbness anywhere. Overall stable since onset.   Review of Systems  Constitutional: Positive for activity change. Negative for appetite change, fatigue, fever and unexpected weight change.  Respiratory: Negative.   Cardiovascular: Negative.   Musculoskeletal: Positive for back pain and myalgias. Negative for arthralgias.  Skin: Negative.   Neurological: Negative for syncope, weakness and numbness.      Objective:   Physical Exam  Constitutional: He is oriented to person, place, and time. He appears well-developed and well-nourished.  HENT:  Head: Normocephalic and atraumatic.  Eyes: EOM are normal.  Neck: Normal range of motion.  Cardiovascular: Normal rate and regular rhythm.  Pulmonary/Chest: Effort normal and breath sounds normal. No respiratory distress. He has no wheezes. He has no rales.  Abdominal: Soft. Bowel sounds are normal. He exhibits no distension. There is no tenderness. There is no rebound.  Musculoskeletal: He exhibits tenderness. He exhibits no edema.  Mild pain low right paraspinal. Worse with bending but not worse with palpation. No spinal tenderness.   Neurological: He is alert and oriented to person, place, and time. Coordination normal.  Skin: Skin is warm and dry.   Vitals:   02/11/18 0947  BP: 118/78  Pulse: (!) 52  Temp: 97.8 F (36.6  C)  TempSrc: Oral  SpO2: 95%  Weight: 174 lb (78.9 kg)  Height: 5\' 8"  (1.727 m)      Assessment & Plan:  Flu shot given at visit

## 2018-04-10 ENCOUNTER — Ambulatory Visit (INDEPENDENT_AMBULATORY_CARE_PROVIDER_SITE_OTHER): Payer: 59 | Admitting: Family Medicine

## 2018-04-10 ENCOUNTER — Other Ambulatory Visit: Payer: 59

## 2018-04-10 ENCOUNTER — Encounter: Payer: Self-pay | Admitting: Family Medicine

## 2018-04-10 VITALS — BP 118/76 | HR 67 | Temp 98.0°F | Ht 68.0 in | Wt 176.0 lb

## 2018-04-10 DIAGNOSIS — R300 Dysuria: Secondary | ICD-10-CM

## 2018-04-10 LAB — POCT URINALYSIS DIPSTICK
Bilirubin, UA: NEGATIVE
Blood, UA: NEGATIVE
GLUCOSE UA: NEGATIVE
Ketones, UA: NEGATIVE
Nitrite, UA: NEGATIVE
Protein, UA: NEGATIVE
Urobilinogen, UA: NEGATIVE E.U./dL — AB
pH, UA: 6 (ref 5.0–8.0)

## 2018-04-10 MED ORDER — NITROFURANTOIN MONOHYD MACRO 100 MG PO CAPS: 100.0000 mg | ORAL_CAPSULE | Freq: Two times a day (BID) | ORAL | 0 refills | Status: DC

## 2018-04-10 NOTE — Addendum Note (Signed)
Addended by: Octivia Canion on: 04/10/2018 04:20 PM   Modules accepted: Orders  

## 2018-04-10 NOTE — Progress Notes (Signed)
Patient ID: ASON HESLIN, male   DOB: 06-Aug-1970, 47 y.o.   MRN: [id number]    PCP: Myrlene Broker, MD  Subjective:  Cameron White is a 47 y.o. year old very pleasant male patient who presents with Urinary Tract symptoms: symptoms including dysuria, and urgency. -started: 2 to 3 weeks, symptoms are not worsening but not improving  -previous treatments: Have tried to increase water intake and cranberry juice to improve symptoms. He reports that increasing water intake has improved symptoms.  -He reports that he has a remote history of UTI after running a marathon and becoming dehydrated and states he has drinks "very little water" No report of difficulty starting/stopping urine or change in urinary stream or hesitancey  ROS-denies fever, chills, sweats, N/V, flank pain, or blood in urine  Pertinent Past Medical History- HTN, Exercise induced asthma, GERD  Medications- reviewed  Current Outpatient Medications  Medication Sig Dispense Refill  . albuterol (PROVENTIL HFA) 108 (90 Base) MCG/ACT inhaler Inhale 2 puffs into the lungs every 4 (four) hours as needed. 1 Inhaler 2  . budesonide-formoterol (SYMBICORT) 160-4.5 MCG/ACT inhaler Inhale 2 puffs into the lungs 2 (two) times daily. 1 Inhaler 5  . fluticasone (FLONASE) 50 MCG/ACT nasal spray Place 2 sprays into both nostrils daily. 16 g 6  . ketoconazole (NIZORAL) 2 % cream APP AA BID  2  . losartan (COZAAR) 50 MG tablet TAKE 1 TABLET(50 MG) BY MOUTH DAILY 90 tablet 1  . pantoprazole (PROTONIX) 40 MG tablet TAKE 1 TABLET BY MOUTH DAILY 90 tablet 3  . sildenafil (VIAGRA) 100 MG tablet Take 0.5-1 tablets (50-100 mg total) by mouth daily as needed for erectile dysfunction. 5 tablet 11   No current facility-administered medications for this visit.     Objective: BP 118/76   Pulse 67   Temp 98 F (36.7 C) (Oral)   Ht 5\' 8"  (1.727 m)   Wt 176 lb (79.8 kg)   SpO2 97%   BMI 26.76 kg/m  Gen: NAD, resting comfortably HEENT:  oropharynx is clear and moist CV: RRR no murmurs rubs or gallops Lungs: CTAB no crackles, wheeze, rhonchi Abdomen: soft/nontender/nondistended/normal bowel sounds. No rebound or guarding.  No CVA tenderness.   Suprapubic tenderness not present Ext: no edema Skin: warm, dry, no rash Neuro: grossly normal, moves all extremities  Assessment/Plan:  Dysuria UA indicates 3+ Leukocytes, -nitrites and blood. History and exam today are suggestive of UTI.  Will send for  culture. Empiric treatment with nitrofurantoin.  Advised patient to complete antibiotic and he should follow up if his symptoms do not improve in 2 to 3 days, worsen, he develops a fever >101, or back pain. Also advised him to force fluids which he has started one day ago and symptoms have improved.  We discussed reasons that men develop UTI symptoms including involvement of prostate. Today, no evidence of prostate involvement through history and patient politely declined DRE today and will follow up with PCP for evaluation if symptoms do not improve with antibiotic therapy.  Exam is reassuring; no fever, chills, flank pain, pelvic pain, or change in urinary stream present.   Finally, we reviewed reasons to return to care including if symptoms worsen or persist or new concerns arise- once again particularly fever, N/V, or flank pain.    Inez Catalina, FNP

## 2018-04-10 NOTE — Patient Instructions (Signed)
Please complete antibiotic as directed. If you develop symptoms of fever >101, pain in your back, or do not improve with treatment that has been provided in 3 to 4 days, please follow up for further evaluation and treatment.  If symptoms are not improved with this treatment, please follow up with Dr. Okey Dupre for further evaluation of symptoms which is likely to include evaluation of your prostate.   Urinary Tract Infection, Adult A urinary tract infection (UTI) is an infection of any part of the urinary tract. The urinary tract includes the:  Kidneys.  Ureters.  Bladder.  Urethra.  These organs make, store, and get rid of pee (urine) in the body. Follow these instructions at home:  Take over-the-counter and prescription medicines only as told by your doctor.  If you were prescribed an antibiotic medicine, take it as told by your doctor. Do not stop taking the antibiotic even if you start to feel better.  Avoid the following drinks: ? Alcohol. ? Caffeine. ? Tea. ? Carbonated drinks.  Drink enough fluid to keep your pee clear or pale yellow.  Keep all follow-up visits as told by your doctor. This is important.  Make sure to: ? Empty your bladder often and completely. Do not to hold pee for long periods of time. ? Empty your bladder before and after sex. ? Wipe from front to back after a bowel movement if you are male. Use each tissue one time when you wipe. Contact a doctor if:  You have back pain.  You have a fever.  You feel sick to your stomach (nauseous).  You throw up (vomit).  Your symptoms do not get better after 3 days.  Your symptoms go away and then come back. Get help right away if:  You have very bad back pain.  You have very bad lower belly (abdominal) pain.  You are throwing up and cannot keep down any medicines or water. This information is not intended to replace advice given to you by your health care provider. Make sure you discuss any  questions you have with your health care provider. Document Released: 11/14/2007 Document Revised: 11/03/2015 Document Reviewed: 04/18/2015 Elsevier Interactive Patient Education  [employer].

## 2018-04-11 LAB — URINE CULTURE
MICRO NUMBER:: 91312029
Result:: NO GROWTH
SPECIMEN QUALITY: ADEQUATE

## 2018-04-14 ENCOUNTER — Telehealth: Payer: Self-pay

## 2018-04-14 NOTE — Telephone Encounter (Signed)
Pt has viewed results via MyChart  

## 2018-04-14 NOTE — Telephone Encounter (Signed)
-----   Message from Roddie Mc, FNP sent at 04/12/2018  8:51 AM EDT ----- Urine culture does not indicate an infection. If symptoms persist, follow up with PCP for further evaluation.

## 2018-04-16 ENCOUNTER — Encounter: Payer: Self-pay | Admitting: Internal Medicine

## 2018-05-22 ENCOUNTER — Ambulatory Visit: Payer: 59 | Admitting: Sports Medicine

## 2018-05-22 ENCOUNTER — Encounter: Payer: Self-pay | Admitting: Sports Medicine

## 2018-05-22 DIAGNOSIS — M76899 Other specified enthesopathies of unspecified lower limb, excluding foot: Secondary | ICD-10-CM | POA: Diagnosis not present

## 2018-05-22 NOTE — Progress Notes (Signed)
Subjective:    Patient ID: Cameron White, male    DOB: 17-Oct-1970, 47 y.o.   MRN: [id number]  HPI  Cameron White is a 47 y.o. male with a h/o BL knee surgery in October 2018 to remove calcified deposits presenting for BL knee pain FU and complaining of R proximal hamstring pain. Pt reports he has BL knee "tightness" but does not really complain of pain. The knee tightness isn't well localized but generally is on the medial aspect of his knees BL; non-radiating; intermittent in nature. He is playing tennis three times per week and does run as well as often as he can. Tightness in his knees is not made worse by a certain activity. He does not have to take any medication for the knees. He will use ice over the knees intermittently which does seem to relieve some soreness and tightness.   The proximal hamstring discomfort started about one month ago when he was running. He does not recall a specific event or injury at that time. Describes as a pulling or pain sensation in the middle of his R buttocks. Pain made worse by running. He can only run about a mile when the pain begins and he normally runs about 3-5 miles on a normal run. Pain is non-radiating; he has not tried many treatment modalities yet for the pain. He does do some light stretching. He is a very active guy playing tennis and running weekly.    Review of Systems; no siciatica or LBP; no weakness Constitutional: Negative MSK: As above    Objective:   Physical Exam Alert and well appearing male resting comfortably on exam table in NAD MSK: Bilateral knee exam Inspection: Previous well healed scar over anterior knees. No swelling or obvious deformity. Mild TTP over medial joint lines but no TTP over MCL, patella and lateral joint line. Thickening noted over R patellar tendon and L quad tendon. ROM complete and intact BL. Strength 5/5 BL, Neurovascular intact. Special testing reveals neg Lachman's, neg Valgus and Varus testing, neg  McMurray's and equivocal squat test.  R Hamstring exam Inspection reveals no swelling, scars, deformities over proximal hamstring and gluteal muscles + TTP over biceps femoris where it attaches on ischial tuberosity nad mild TTP over glut medius as well. Complete and intact ROM in R hip. Strength 5/5 BL LE. Neurovascular intact. Special tests reveal neg log roll, neg FABIR and FADER, neg SI joint loading, neg Stork test.  Ultrasound of knees  No SPP swelling US  demonstrates calcified deposits in R patella tendon and L quad tendon.  Some minimal quad calcifications persist on RT as well.. Menisci BL appear to be intact.  No abnormal doppler flow Joint space preserved with no spurring  Impression: chronic calcific quadriceps and patellar tendinopathy, stable from last exam  Ultrasound and interpretation by Sibyl Parr. Cameron Baena, MD           Assessment & Plan:  Cameron White is a 47 y.o. male with a h/o BL knee surgery in October 2018 to remove calcified deposits presenting for BL knee pain FU and complaining of R proximal hamstring pain.  BL knee pain -Clinically stable and improved -POC [place] re-demonstrates chronic calcified deposits likely contributing to soreness and tightness -Patella strap as needed when running -Exercises demonstrated and handout given for wall slides, decline squats and unilateral knee bends -Compression therapy as needed -Ice/heat as directed -NSAIDs and Tylenol prn for pain -Increase activity as tolerated -Education provided  Hamstring pain -Exam unremarkable today -Hamstring belt as needed during running -Demonstrated Askling exercises and handout given; can then try Nordic hamstring curls after Askling exercises -NSAIDs and Tylenol prn for pain -Ice/heat as directed -Increase activity as tolerated -Education provided  RTC as needed if pain become acutely worse.  Staff: Dr. Gwenyth BouillonFields  Sean Wilen, MD FP, PGY-3  I observed and examined the  patient with the resident and agree with assessment and plan.  Note reviewed and modified by me. Aida Lemaire B. Darrick PennaFields, MD

## 2018-05-22 NOTE — Assessment & Plan Note (Signed)
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

## 2018-05-22 NOTE — Progress Notes (Deleted)
Subjective:    Patient ID:   Chief Complaint: HPI {[id number]}{HPI:[id number]} {Also Blocks:[id number]::" "}   Social History   Occupational History  . Not on file  Tobacco Use  . Smoking status: Never Smoker  . Smokeless tobacco: Never Used  Substance and Sexual Activity  . Alcohol use: Yes    Alcohol/week: 4.0 standard drinks    Types: 4 Standard drinks or equivalent per week    Comment: occasional  . Drug use: No  . Sexual activity: Not on file    ROS       Objective:   Ortho Exam       Assessment:     No diagnosis found.     Plan:     ***

## 2018-06-12 ENCOUNTER — Encounter: Payer: Self-pay | Admitting: Internal Medicine

## 2018-06-13 ENCOUNTER — Other Ambulatory Visit: Payer: Self-pay | Admitting: Internal Medicine

## 2018-06-13 MED ORDER — OSELTAMIVIR PHOSPHATE 75 MG PO CAPS: 75.0000 mg | ORAL_CAPSULE | Freq: Two times a day (BID) | ORAL | 0 refills | Status: AC

## 2018-06-30 ENCOUNTER — Encounter: Payer: Self-pay | Admitting: Family

## 2018-06-30 ENCOUNTER — Ambulatory Visit (INDEPENDENT_AMBULATORY_CARE_PROVIDER_SITE_OTHER): Payer: 59 | Admitting: Family

## 2018-06-30 ENCOUNTER — Telehealth: Payer: Self-pay | Admitting: Family

## 2018-06-30 ENCOUNTER — Other Ambulatory Visit: Payer: Self-pay | Admitting: Family

## 2018-06-30 VITALS — BP 116/84 | HR 58 | Temp 97.9°F | Ht 68.0 in | Wt 179.0 lb

## 2018-06-30 DIAGNOSIS — K219 Gastro-esophageal reflux disease without esophagitis: Secondary | ICD-10-CM | POA: Diagnosis not present

## 2018-06-30 DIAGNOSIS — R05 Cough: Secondary | ICD-10-CM

## 2018-06-30 DIAGNOSIS — R059 Cough, unspecified: Secondary | ICD-10-CM

## 2018-06-30 LAB — POCT EXHALED NITRIC OXIDE: FeNO level (ppb): 8

## 2018-06-30 MED ORDER — PANTOPRAZOLE SODIUM 40 MG PO TBEC: 40.0000 mg | DELAYED_RELEASE_TABLET | Freq: Two times a day (BID) | ORAL | 3 refills | Status: DC

## 2018-06-30 MED ORDER — FLUTICASONE PROPIONATE 50 MCG/ACT NA SUSP: 2.0000 | Freq: Every day | NASAL | 6 refills | Status: DC

## 2018-06-30 NOTE — Progress Notes (Signed)
Cameron White is a 48 y.o. male with the following history as recorded in EpicCare:  Patient Active Problem List   Diagnosis Date Noted  . Low back pain 02/11/2018  . Quadriceps tendinitis 07/18/2017  . Hallux rigidus of right foot 12/18/2016  . Knee pain, right 08/11/2015  . Routine general medical examination at a health care facility 01/07/2015  . Hypertension   . GERD 06/06/2010  . Asthma 08/09/2009    Current Outpatient Medications  Medication Sig Dispense Refill  . losartan (COZAAR) 50 MG tablet Take 1 tablet (50 mg total) by mouth daily. Need annual appointment for further refills 90 tablet 0  . pantoprazole (PROTONIX) 40 MG tablet Take 1 tablet (40 mg total) by mouth 2 (two) times daily. TAKE 1 TABLET BY MOUTH DAILY 60 tablet 3  . sildenafil (VIAGRA) 100 MG tablet Take 0.5-1 tablets (50-100 mg total) by mouth daily as needed for erectile dysfunction. 5 tablet 11  . fluticasone (FLONASE) 50 MCG/ACT nasal spray Place 2 sprays into both nostrils daily. 16 g 6  . ketoconazole (NIZORAL) 2 % cream APP AA BID  2   No current facility-administered medications for this visit.     Allergies: Patient has no known allergies.  Past Medical History:  Diagnosis Date  . Achilles tendinitis on left   . ASTHMA   . GERD   . HALLUX RIGIDUS, ACQUIRED   . History of knee surgery 04/03/2017  . Hypertension   . Patellar tendinitis   . SACROILIAC JOINT DYSFUNCTION     Past Surgical History:  Procedure Laterality Date  . bone spurs Bilateral 03/2017   bone spurs removed  . NO PAST SURGERIES      Family History  Problem Relation Age of Onset  . Hypertension Father   . Heart disease Paternal Grandfather   . Hypertension Paternal Grandfather   . Alcohol abuse Other        parent & grandparent  . Hypertension Other     Social History   Tobacco Use  . Smoking status: Never Smoker  . Smokeless tobacco: Never Used  Substance Use Topics  . Alcohol use: Yes    Alcohol/week: 4.0  standard drinks    Types: 4 Standard drinks or equivalent per week    Comment: occasional    Subjective:  Patient presents with concerns for 2 month history of cough/ congestion/ stuffy nose; has known reflux- takes Protonix; has allergies- takes Flonase at night; patient feels that food is the major cause of his symptoms; No nighttime symptoms. Does not have to take oral antihistamines; able to exercise with no difficulty- not having to use his rescue inhaler; notes that mother has complicated GERD- had to go to [facility] to see a specialist. No changes in bowel movements, no coffee grounds emesis;    Objective:  Vitals:   06/30/18 1307  BP: 116/84  Pulse: (!) 58  Temp: 97.9 F (36.6 C)  TempSrc: Oral  SpO2: 96%  Weight: 179 lb (81.2 kg)  Height: 5\' 8"  (1.727 m)    General: Well developed, well nourished, in no acute distress  Skin : Warm and dry.  Head: Normocephalic and atraumatic  Eyes: Sclera and conjunctiva clear; pupils round and reactive to light; extraocular movements intact  Ears: External normal; canals clear; tympanic membranes normal  Oropharynx: Pink, supple. No suspicious lesions  Neck: Supple without thyromegaly, adenopathy  Lungs: Respirations unlabored; clear to auscultation bilaterally without wheeze, rales, rhonchi  CVS exam: normal rate and  regular rhythm.  Abdomen: Soft; nontender; nondistended; normoactive bowel sounds; no masses or hepatosplenomegaly  Neurologic: Alert and oriented; speech intact; face symmetrical; moves all extremities well; CNII-XII intact without focal deficit   Assessment:  1. Cough   2. Gastroesophageal reflux disease without esophagitis     Plan:  FeNo updated today- at 8; low suspicion for asthma source; increase Protonix to 40 mg bid; recommend to see GI- he plans to see his mother's MD at [facility] and will call back if he needs a referral. If symptoms do not improve in the next 1-2 week, will need to get updated CXR; last  CXR done in 06/2017 for similar symptoms.   No follow-ups on file.  Orders Placed This Encounter  Procedures  . DG Chest 2 View    Standing Status:   Future    Standing Expiration Date:   08/29/2019    Order Specific Question:   Reason for Exam (SYMPTOM  OR DIAGNOSIS REQUIRED)    Answer:   cough    Order Specific Question:   Preferred imaging location?    Answer:   [facility]    Order Specific Question:   Radiology Contrast Protocol - do NOT remove file path    Answer:   [email or web address]  . POCT EXHALED NITRIC OXIDE    Requested Prescriptions   Signed Prescriptions Disp Refills  . fluticasone (FLONASE) 50 MCG/ACT nasal spray 16 g 6    Sig: Place 2 sprays into both nostrils daily.  . pantoprazole (PROTONIX) 40 MG tablet 60 tablet 3    Sig: Take 1 tablet (40 mg total) by mouth 2 (two) times daily. TAKE 1 TABLET BY MOUTH DAILY

## 2018-06-30 NOTE — Telephone Encounter (Signed)
Copied from CRM [id number]. Topic: General - Other >> White 20, 2020  1:58 PM Percival Spanish wrote:  Cameron White with Cameron White call to say the directions are confusing please check and let the pharmacy know

## 2018-06-30 NOTE — Patient Instructions (Signed)
Try increasing the Protonix to twice a day;   Gastroesophageal Reflux Disease, Adult Gastroesophageal reflux (GER) happens when acid from the stomach flows up into the tube that connects the mouth and the stomach (esophagus). Normally, food travels down the esophagus and stays in the stomach to be digested. With GER, food and stomach acid sometimes move back up into the esophagus. You may have a disease called gastroesophageal reflux disease (GERD) if the reflux:  Happens often.  Causes frequent or very bad symptoms.  Causes problems such as damage to the esophagus. When this happens, the esophagus becomes sore and swollen (inflamed). Over time, GERD can make small holes (ulcers) in the lining of the esophagus. What are the causes? This condition is caused by a problem with the muscle between the esophagus and the stomach. When this muscle is weak or not normal, it does not close properly to keep food and acid from coming back up from the stomach. The muscle can be weak because of:  Tobacco use.  Pregnancy.  Having a certain type of hernia (hiatal hernia).  Alcohol use.  Certain foods and drinks, such as coffee, chocolate, onions, and peppermint. What increases the risk? You are more likely to develop this condition if you:  Are overweight.  Have a disease that affects your connective tissue.  Use NSAID medicines. What are the signs or symptoms? Symptoms of this condition include:  Heartburn.  Difficult or painful swallowing.  The feeling of having a lump in the throat.  A bitter taste in the mouth.  Bad breath.  Having a lot of saliva.  Having an upset or bloated stomach.  Belching.  Chest pain. Different conditions can cause chest pain. Make sure you see your doctor if you have chest pain.  Shortness of breath or noisy breathing (wheezing).  Ongoing (chronic) cough or a cough at night.  Wearing away of the surface of teeth (tooth enamel).  Weight  loss. How is this treated? Treatment will depend on how bad your symptoms are. Your doctor may suggest:  Changes to your diet.  Medicine.  Surgery. Follow these instructions at home: Eating and drinking   Follow a diet as told by your doctor. You may need to avoid foods and drinks such as: ? Coffee and tea (with or without caffeine). ? Drinks that contain alcohol. ? Energy drinks and sports drinks. ? Bubbly (carbonated) drinks or sodas. ? Chocolate and cocoa. ? Peppermint and mint flavorings. ? Garlic and onions. ? Horseradish. ? Spicy and acidic foods. These include peppers, chili powder, curry powder, vinegar, hot sauces, and BBQ sauce. ? Citrus fruit juices and citrus fruits, such as oranges, lemons, and limes. ? Tomato-based foods. These include red sauce, chili, salsa, and pizza with red sauce. ? Fried and fatty foods. These include donuts, french fries, potato chips, and high-fat dressings. ? High-fat meats. These include hot dogs, rib eye steak, sausage, ham, and bacon. ? High-fat dairy items, such as whole milk, butter, and cream cheese.  Eat small meals often. Avoid eating large meals.  Avoid drinking large amounts of liquid with your meals.  Avoid eating meals during the 2-3 hours before bedtime.  Avoid lying down right after you eat.  Do not exercise right after you eat. Lifestyle   Do not use any products that contain nicotine or tobacco. These include cigarettes, e-cigarettes, and chewing tobacco. If you need help quitting, ask your doctor.  Try to lower your stress. If you need help doing  this, ask your doctor.  If you are overweight, lose an amount of weight that is healthy for you. Ask your doctor about a safe weight loss goal. General instructions  Pay attention to any changes in your symptoms.  Take over-the-counter and prescription medicines only as told by your doctor. Do not take aspirin, ibuprofen, or other NSAIDs unless your doctor says it is  okay.  Wear loose clothes. Do not wear anything tight around your waist.  Raise (elevate) the head of your bed about 6 inches (15 cm).  Avoid bending over if this makes your symptoms worse.  Keep all follow-up visits as told by your doctor. This is important. Contact a doctor if:  You have new symptoms.  You lose weight and you do not know why.  You have trouble swallowing or it hurts to swallow.  You have wheezing or a cough that keeps happening.  Your symptoms do not get better with treatment.  You have a hoarse voice. Get help right away if:  You have pain in your arms, neck, jaw, teeth, or back.  You feel sweaty, dizzy, or light-headed.  You have chest pain or shortness of breath.  You throw up (vomit) and your throw-up looks like blood or coffee grounds.  You pass out (faint).  Your poop (stool) is bloody or black.  You cannot swallow, drink, or eat. Summary  If a person has gastroesophageal reflux disease (GERD), food and stomach acid move back up into the esophagus and cause symptoms or problems such as damage to the esophagus.  Treatment will depend on how bad your symptoms are.  Follow a diet as told by your doctor.  Take all medicines only as told by your doctor. This information is not intended to replace advice given to you by your health care provider. Make sure you discuss any questions you have with your health care provider. Document Released: 11/14/2007 Document Revised: 12/04/2017 Document Reviewed: 12/04/2017 Elsevier Interactive Patient Education  2019 [employer].

## 2018-06-30 NOTE — Telephone Encounter (Signed)
Please advise. Thanks.  

## 2018-06-30 NOTE — Telephone Encounter (Signed)
I assume this is for pantoprazole (PROTONIX) 40 MG tablet that was sent in today.

## 2018-09-17 ENCOUNTER — Other Ambulatory Visit: Payer: Self-pay | Admitting: Internal Medicine

## 2018-12-19 ENCOUNTER — Other Ambulatory Visit: Payer: Self-pay | Admitting: Internal Medicine

## 2018-12-30 ENCOUNTER — Other Ambulatory Visit: Payer: Self-pay

## 2018-12-30 ENCOUNTER — Ambulatory Visit: Payer: 59 | Admitting: Sports Medicine

## 2018-12-30 VITALS — BP 130/90 | Ht 68.0 in | Wt 170.0 lb

## 2018-12-30 DIAGNOSIS — S86811A Strain of other muscle(s) and tendon(s) at lower leg level, right leg, initial encounter: Secondary | ICD-10-CM | POA: Diagnosis not present

## 2018-12-31 ENCOUNTER — Encounter: Payer: Self-pay | Admitting: Sports Medicine

## 2018-12-31 NOTE — Progress Notes (Signed)
   Subjective:    Patient ID: Cameron White, male    DOB: 01-06-1971, 48 y.o.   MRN: [id number]  HPI chief complaint: Right calf pain  Vince comes in today complaining of 6 weeks of right calf pain.  His main complaint is calf tightness with running.  He has had similar issues in the past.  He is tried to modify his activity but continues to endorse a feeling of tightness.  He denies any specific injury.  He has not noticed swelling.  No bruising.  He localizes his pain to the medial aspect of the right calf.  He is also getting some pain with biking.  He denies numbness or tingling but does endorse some mild weakness with calf raises but he thinks that that may be due to his discomfort.  Past medical history reviewed Medications reviewed Allergies reviewed   Review of Systems    As above Objective:   Physical Exam  Well-developed, well-nourished.  No acute distress.  Awake alert and oriented x3.  Vital signs reviewed  Right calf: Patient is tender to palpation along the medial aspect of the calf, specifically near the musculotendinous junction of the soleus and the gastrocnemius.  There is no palpable defect.  There is no ecchymosis.  There is no soft tissue swelling.  Neurological exam: Negative straight leg raise.  Right calf measures 35.5 cm, left calf measures 36 cm.  Strength is 5/5 with ankle dorsiflexion, plantarflexion, and great toe extension.  Sensation is intact to light touch distally.  Reflexes are brisk and equal at the patellar and Achilles tendons bilaterally.  MSK ultrasound of the right calf was performed.  Limited images were obtained.  Scan was focused around the point of maximum tenderness.  There are hypoechoic changes in this area consistent with scar tissue from previous injury but I do not see any hypoechoic changes suggesting acute injury.      Assessment & Plan:   Right calf pain likely secondary to calf strain  Review of the patient's chart shows that  he had a similar episode in 2016 which was treated conservatively with heel lifts, nitroglycerin patches, Alfredson eccentric exercises, and exercise modification.  We are going to simply repeat that protocol and reevaluate him in 4 weeks.  In the meantime, I recommended that he cross train on a bike instead of running as long as his pain is minimal.  I've also recommended no tennis until follow-up.  If symptoms persist or worsen despite today's treatment, then consideration should be given to referred pain from other sources such as his lumbar spine.  I did briefly discuss getting lumbar spine x-rays with him today but he would like to wait until follow-up which I think is reasonable.  Patient will call with questions or concerns prior to that follow-up visit.

## 2019-01-01 ENCOUNTER — Encounter: Payer: Self-pay | Admitting: Internal Medicine

## 2019-01-02 ENCOUNTER — Ambulatory Visit (INDEPENDENT_AMBULATORY_CARE_PROVIDER_SITE_OTHER): Payer: 59 | Admitting: Family

## 2019-01-02 ENCOUNTER — Encounter: Payer: Self-pay | Admitting: Family

## 2019-01-02 ENCOUNTER — Other Ambulatory Visit: Payer: Self-pay

## 2019-01-02 DIAGNOSIS — J309 Allergic rhinitis, unspecified: Secondary | ICD-10-CM | POA: Diagnosis not present

## 2019-01-02 MED ORDER — BUDESONIDE-FORMOTEROL FUMARATE 160-4.5 MCG/ACT IN AERO: 2.0000 | INHALATION_SPRAY | Freq: Two times a day (BID) | RESPIRATORY_TRACT | 0 refills | Status: DC

## 2019-01-02 MED ORDER — FLUTICASONE PROPIONATE 50 MCG/ACT NA SUSP: 2.0000 | Freq: Every day | NASAL | 6 refills | Status: AC

## 2019-01-02 NOTE — Progress Notes (Signed)
Cameron White is a 48 y.o. male with the following history as recorded in EpicCare:  Patient Active Problem List   Diagnosis Date Noted  . Low back pain 02/11/2018  . Quadriceps tendinitis 07/18/2017  . Hallux rigidus of right foot 12/18/2016  . Knee pain, right 08/11/2015  . Routine general medical examination at a health care facility 01/07/2015  . Hypertension   . GERD 06/06/2010  . Asthma 08/09/2009    Current Outpatient Medications  Medication Sig Dispense Refill  . budesonide-formoterol (SYMBICORT) 160-4.5 MCG/ACT inhaler Inhale 2 puffs into the lungs 2 (two) times daily. 1 Inhaler 0  . fluticasone (FLONASE) 50 MCG/ACT nasal spray Place 2 sprays into both nostrils daily. 16 g 6  . fluticasone (FLONASE) 50 MCG/ACT nasal spray Place 2 sprays into both nostrils daily. 16 g 6  . ketoconazole (NIZORAL) 2 % cream APP AA BID  2  . losartan (COZAAR) 50 MG tablet Take 1 tablet (50 mg total) by mouth daily. Need visit for further refills 90 tablet 0  . pantoprazole (PROTONIX) 40 MG tablet Take 1 tablet (40 mg total) by mouth 2 (two) times daily. 60 tablet 3  . sildenafil (VIAGRA) 100 MG tablet Take 0.5-1 tablets (50-100 mg total) by mouth daily as needed for erectile dysfunction. 5 tablet 11   No current facility-administered medications for this visit.     Allergies: Patient has no known allergies.  Past Medical History:  Diagnosis Date  . Achilles tendinitis on left   . ASTHMA   . GERD   . HALLUX RIGIDUS, ACQUIRED   . History of knee surgery 04/03/2017  . Hypertension   . Patellar tendinitis   . SACROILIAC JOINT DYSFUNCTION     Past Surgical History:  Procedure Laterality Date  . bone spurs Bilateral 03/2017   bone spurs removed  . NO PAST SURGERIES      Family History  Problem Relation Age of Onset  . Hypertension Father   . Heart disease Paternal Grandfather   . Hypertension Paternal Grandfather   . Alcohol abuse Other        parent & grandparent  . Hypertension  Other     Social History   Tobacco Use  . Smoking status: Never Smoker  . Smokeless tobacco: Never Used  Substance Use Topics  . Alcohol use: Yes    Alcohol/week: 4.0 standard drinks    Types: 4 Standard drinks or equivalent per week    Comment: occasional    Subjective:    I connected with [name] on 01/02/19 at  2:20 PM EDT by a video enabled telemedicine application and verified that I am speaking with the correct person using two identifiers. Patient and I are the only 2 people on the video call;    I discussed the limitations of evaluation and management by telemedicine and the availability of in person appointments. The patient expressed understanding and agreed to proceed.  Complaining of 1 week history of cough/ congestion; prone to seasonal allergies but has not taken any medication; no fever, no shortness of breath; does have underlying GERD but feels like this is well controlled;     Objective:  There were no vitals filed for this visit.  General: Well developed, well nourished, in no acute distress  Skin : Warm and dry.  Head: Normocephalic and atraumatic  Lungs: Respirations unlabored;  Neurologic: Alert and oriented; speech intact; face symmetrical;   Assessment:  1. Allergic rhinitis, unspecified seasonality, unspecified trigger  Plan:  Low suspicion for infection; will have patient re-start his Zyrtec and Flonase; refill given on Symbicort to use to help cover for secondary asthma symptoms; increase fluids, rest and follow-up worse, no better.   No follow-ups on file.  No orders of the defined types were placed in this encounter.   Requested Prescriptions   Signed Prescriptions Disp Refills  . fluticasone (FLONASE) 50 MCG/ACT nasal spray 16 g 6    Sig: Place 2 sprays into both nostrils daily.  . budesonide-formoterol (SYMBICORT) 160-4.5 MCG/ACT inhaler 1 Inhaler 0    Sig: Inhale 2 puffs into the lungs 2 (two) times daily.

## 2019-01-05 ENCOUNTER — Encounter: Payer: Self-pay | Admitting: Family

## 2019-01-05 ENCOUNTER — Other Ambulatory Visit: Payer: Self-pay | Admitting: Family

## 2019-01-05 MED ORDER — AMOXICILLIN-POT CLAVULANATE 875-125 MG PO TABS: 1.0000 | ORAL_TABLET | Freq: Two times a day (BID) | ORAL | 0 refills | Status: DC

## 2019-01-08 ENCOUNTER — Other Ambulatory Visit: Payer: Self-pay

## 2019-01-08 DIAGNOSIS — Z20822 Contact with and (suspected) exposure to covid-19: Secondary | ICD-10-CM

## 2019-01-10 LAB — NOVEL CORONAVIRUS, NAA: SARS-CoV-2, NAA: NOT DETECTED

## 2019-01-14 ENCOUNTER — Other Ambulatory Visit: Payer: Self-pay | Admitting: Family

## 2019-01-15 ENCOUNTER — Encounter: Payer: Self-pay | Admitting: Family

## 2019-01-16 ENCOUNTER — Other Ambulatory Visit: Payer: Self-pay | Admitting: Family

## 2019-01-16 MED ORDER — ALBUTEROL SULFATE HFA 108 (90 BASE) MCG/ACT IN AERS: 2.0000 | INHALATION_SPRAY | Freq: Four times a day (QID) | RESPIRATORY_TRACT | 0 refills | Status: DC | PRN

## 2019-01-27 ENCOUNTER — Other Ambulatory Visit: Payer: Self-pay

## 2019-01-27 ENCOUNTER — Ambulatory Visit: Payer: 59 | Admitting: Sports Medicine

## 2019-01-27 ENCOUNTER — Encounter: Payer: Self-pay | Admitting: Sports Medicine

## 2019-01-27 VITALS — BP 140/92 | Ht 68.0 in | Wt 165.0 lb

## 2019-01-27 DIAGNOSIS — S86811D Strain of other muscle(s) and tendon(s) at lower leg level, right leg, subsequent encounter: Secondary | ICD-10-CM | POA: Diagnosis not present

## 2019-01-27 MED ORDER — NITROGLYCERIN 0.2 MG/HR TD PT24: MEDICATED_PATCH | TRANSDERMAL | 0 refills | Status: DC

## 2019-01-27 NOTE — Progress Notes (Signed)
   [facility] [address] [place], [place] [address] Phone: [phone] Fax: [phone]   Patient Name: Cameron White Date of Birth: Oct 05, 1970 Medical Record Number: [id number] Gender: male Date of Encounter: 01/27/2019  SUBJECTIVE:      Chief Complaint:  Right calf pain   HPI:  This is a 48 year old male presenting for follow-up of right calf pain.  Since last visit he has been diligently been doing his exercises, wearing his heel insert, using nitro patches and wearing his calf sleeve when exercising.  Was able to run 3 days in a row last week with no pain.  On Sunday he did play tennis (forgot his sleeve) and in the third set felt tightness, and woke up yesterday morning with pain.  Overall he states he is feeling improved.  He denies any swelling, bruising, radiating pain, low back pain.     ROS:     See HPI.   PERTINENT  PMH / PSH / FH / SH:  Past Medical, Surgical, Social, and Family History Reviewed & Updated in the EMR.    OBJECTIVE:  BP (!) 140/92   Ht 5\' 8"  (1.727 m)   Wt 165 lb (74.8 kg)   BMI 25.09 kg/m  Physical Exam:  Vital signs are reviewed.   GEN: Alert and oriented, NAD Pulm: Breathing unlabored PSY: normal mood, congruent affect  MSK: Right calf: Nitro patch in place on medial aspect of calf with mild TTP near musculotendinous junction of medial gastroc.  No ecchymosis or swelling.  Strength is 5/5 with ankle dorsi and plantar flexion.  Sensation is intact bilaterally. Negative squeeze test.  ASSESSMENT & PLAN:   1. Right calf strain Overall patient is improving with conservative management.  We will continue current treatment plan for at least 4 more weeks.  Discussed with patient letting his body be the guide to increasing activity level, explaining tennis may be too explosive for his calf at this time.  Sent refill of his nitro patches to the pharmacy.  He will follow-up as needed or if symptoms worsen.  Note  was provided for patient to utilize gym for appropriate rehab exercises that cannot be performed at home.   Lanier Clam, DO, ATC Sports Medicine Fellow  Patient seen and evaluated with the sports medicine fellow.  I agree with the above plan of care.  Follow-up for ongoing or recalcitrant issues.

## 2019-01-29 ENCOUNTER — Other Ambulatory Visit: Payer: Self-pay | Admitting: Family

## 2019-02-06 ENCOUNTER — Ambulatory Visit (INDEPENDENT_AMBULATORY_CARE_PROVIDER_SITE_OTHER)
Admission: RE | Admit: 2019-02-06 | Discharge: 2019-02-06 | Disposition: A | Discharge status: Home / Self Care | Payer: 59 | Source: Ambulatory Visit | Attending: Internal Medicine | Admitting: Internal Medicine

## 2019-02-06 ENCOUNTER — Encounter: Payer: Self-pay | Admitting: Internal Medicine

## 2019-02-06 ENCOUNTER — Ambulatory Visit (INDEPENDENT_AMBULATORY_CARE_PROVIDER_SITE_OTHER): Payer: 59 | Admitting: Internal Medicine

## 2019-02-06 ENCOUNTER — Other Ambulatory Visit: Payer: Self-pay

## 2019-02-06 DIAGNOSIS — R05 Cough: Secondary | ICD-10-CM

## 2019-02-06 DIAGNOSIS — R059 Cough, unspecified: Secondary | ICD-10-CM

## 2019-02-06 NOTE — Progress Notes (Signed)
Virtual Visit via Video Note  I connected with Cameron White on 02/06/19 at  2:00 PM EDT by a video enabled telemedicine application and verified that I am speaking with the correct person using two identifiers.  The patient and the provider were at separate locations throughout the entire encounter.   I discussed the limitations of evaluation and management by telemedicine and the availability of in person appointments. The patient expressed understanding and agreed to proceed.  History of Present Illness: The patient is a 48 y.o. man with visit for cough and GERD. Started about 6-8 weeks ago and had virtual visit about 1 month ago. Given flonase and symbicort and augmentin. The flonase dried up his nasal drainage. Symbicort it is unclear if this has helped much. Uses albuterol before exercise. Not having to limit activities due to SOB. Some minimal wheezing not in weeks. Cough which is rarely productive. The antibiotic did not seem to make a huge difference. He is taking protonix BID and has been taking it like this for some time. Denies weight loss. More coughing after eating. Food does not get stuck. Denies blood in stool. Denies nsaids otc recently. Denies changes in meds. Has asthma but seems okay recently except the coughing. COVID test end of July was negative and no fevers or chills. Overall it is stable.   Observations/Objective: Appearance: normal, breathing appears normal but coughing some non-productive during visit, no audible wheezing, casual grooming, abdomen does not appear distended, throat with mild drainage but no redness, memory normal, mental status is A and O times3  CXR 02/06/19: preliminary read done by physician with continued borderline cardiomegaly, some stable scarring left base, no acute process such as pneumonia  Assessment and Plan: See problem oriented charting  Follow Up Instructions: CXR to rule out pneumonia, suspect coming from GERD. If CXR clear will switch  GERD agent and if CXR with findings treat appropriately.   Visit time 25 minutes: greater than 50% of that time was spent in face to face counseling and coordination of care with the patient: counseled about treatment options as well as likely cause  I discussed the assessment and treatment plan with the patient. The patient was provided an opportunity to ask questions and all were answered. The patient agreed with the plan and demonstrated an understanding of the instructions.   The patient was advised to call back or seek an in-person evaluation if the symptoms worsen or if the condition fails to improve as anticipated.  Hoyt Koch, MD

## 2019-02-06 NOTE — Telephone Encounter (Signed)
Appointment scheduled (DOXY).

## 2019-02-06 NOTE — Assessment & Plan Note (Signed)
Suspect that this is related to GERD which could be causing some mild flare of asthma rather than underlying asthma. Symbicort did not help symptoms. He is taking protonix BID already so if CXR clear will change to a different PPI. CXR ordered today and preliminary read by this physician does not show acute process. Will verify once read by radiologist concurrence. If no resolution with different PPI may need to see GI for possible endoscopy.

## 2019-02-09 ENCOUNTER — Other Ambulatory Visit: Payer: Self-pay | Admitting: Internal Medicine

## 2019-02-09 MED ORDER — ESOMEPRAZOLE MAGNESIUM 40 MG PO CPDR: 40.0000 mg | DELAYED_RELEASE_CAPSULE | Freq: Every day | ORAL | 3 refills | Status: DC

## 2019-02-13 ENCOUNTER — Other Ambulatory Visit: Payer: Self-pay | Admitting: Family

## 2019-02-24 ENCOUNTER — Ambulatory Visit: Payer: 59 | Admitting: Sports Medicine

## 2019-02-24 ENCOUNTER — Other Ambulatory Visit: Payer: Self-pay

## 2019-02-24 DIAGNOSIS — K219 Gastro-esophageal reflux disease without esophagitis: Secondary | ICD-10-CM | POA: Diagnosis not present

## 2019-02-24 DIAGNOSIS — J4520 Mild intermittent asthma, uncomplicated: Secondary | ICD-10-CM

## 2019-02-24 NOTE — Progress Notes (Signed)
Calf Pain  Started in July Has done HEP Pain today is 0/10 Has played tennis 3 x with no pain Able to do calf raises with no residual pain  Reflux  Trying different omeprazole dose Still getting burning Wonders if this is triggering cough which seems to occur with exercise at times  Does feel some irritation in the morning  Asthma On symbicort bid Uses rare albuterol More coughing recently with runs and bikes  ROS No nasal congestion No fever or chills  PE NAD BP (!) 142/92   Ht 5\' 8"  (1.727 m)   Wt 165 lb (74.8 kg)   BMI 25.09 kg/m  No respiratory distress or wheezing  Calf is normal to inspection and palpation AT is normal Dynamic heel raises with no pain  Impression: Gastroc strain resolved  Cont on maintenance calf raise program

## 2019-02-24 NOTE — Assessment & Plan Note (Signed)
On omeprazole  Will add some chewable tums during day Cut Social ETOH as that may be a trigger Has cut coffee and spices

## 2019-02-24 NOTE — Assessment & Plan Note (Signed)
I believe we may need to do a running challenge with serial PEFR to see if his issue is really asthma Will TX reflux but if sxs persist do the test

## 2019-02-27 ENCOUNTER — Other Ambulatory Visit: Payer: Self-pay | Admitting: Sports Medicine

## 2019-02-27 ENCOUNTER — Encounter: Payer: Self-pay | Admitting: Internal Medicine

## 2019-03-03 ENCOUNTER — Encounter: Payer: Self-pay | Admitting: Internal Medicine

## 2019-03-05 ENCOUNTER — Encounter: Payer: Self-pay | Admitting: Internal Medicine

## 2019-03-05 ENCOUNTER — Other Ambulatory Visit: Payer: Self-pay

## 2019-03-05 ENCOUNTER — Ambulatory Visit (INDEPENDENT_AMBULATORY_CARE_PROVIDER_SITE_OTHER): Payer: 59 | Admitting: Internal Medicine

## 2019-03-05 VITALS — BP 122/88 | HR 52 | Temp 98.3°F | Ht 68.0 in | Wt 178.4 lb

## 2019-03-05 DIAGNOSIS — R059 Cough, unspecified: Secondary | ICD-10-CM

## 2019-03-05 DIAGNOSIS — R05 Cough: Secondary | ICD-10-CM | POA: Diagnosis not present

## 2019-03-05 LAB — POCT EXHALED NITRIC OXIDE: FeNO level (ppb): 23

## 2019-03-05 NOTE — Assessment & Plan Note (Signed)
FENO done today which is 23 which is low. Likely his asthma is not related to cough. He is likely with GERD as the cause. He is improving gradually at this time. Will continue to wait several more weeks to month or so. Avoid triggers. If worsening or not improving consider GI for EGD. Has seen ENT and had scope with GERD on the vocal cords about 1 year ago when he had a flare but this resolved.

## 2019-03-05 NOTE — Progress Notes (Signed)
   Subjective:   Patient ID: Cameron White, male    DOB: April 14, 1971, 48 y.o.   MRN: [id number]  HPI The patient is a 48 YO man coming in for ongoing cough. Has been going on since July with negative covid-19 testing. He does have GERD and asthma and it is unclear. He did get seasonal cold at the onset. We have changed GERD medication to nexium. He has stopped alcohol for the last month or so and coffee for last few weeks and sodas and carbonated beverages. This has helped. He had a decaf coffee this week and started coughing a lot. Overall he feels this is improving gradually. Denies fevers or chills. Denies SOB. Denies nasal drainage. Has symbicort and this is not helping. Albuterol does not seem to help much either.   Review of Systems  Constitutional: Negative.   HENT: Negative.   Eyes: Negative.   Respiratory: Positive for cough. Negative for chest tightness and shortness of breath.   Cardiovascular: Negative for chest pain, palpitations and leg swelling.  Gastrointestinal: Negative for abdominal distention, abdominal pain, constipation, diarrhea, nausea and vomiting.  Musculoskeletal: Negative.   Skin: Negative.   Neurological: Negative.   Psychiatric/Behavioral: Negative.     Objective:  Physical Exam Constitutional:      Appearance: He is well-developed.  HENT:     Head: Normocephalic and atraumatic.  Neck:     Musculoskeletal: Normal range of motion.  Cardiovascular:     Rate and Rhythm: Normal rate and regular rhythm.  Pulmonary:     Effort: Pulmonary effort is normal. No respiratory distress.     Breath sounds: Normal breath sounds. No wheezing or rales.  Abdominal:     General: Bowel sounds are normal. There is no distension.     Palpations: Abdomen is soft.     Tenderness: There is no abdominal tenderness. There is no rebound.  Skin:    General: Skin is warm and dry.  Neurological:     Mental Status: He is alert and oriented to person, place, and time.   Coordination: Coordination normal.     Vitals:   03/05/19 0850  BP: 122/88  Pulse: (!) 52  Temp: 98.3 F (36.8 C)  TempSrc: Oral  SpO2: 96%  Weight: 178 lb 6.4 oz (80.9 kg)  Height: 5\' 8"  (1.727 m)   FENO: 23  Assessment & Plan:  Visit time 25 minutes: greater than 50% of that time was spent in face to face counseling and coordination of care with the patient: counseled about cough, asthma gerd and FENO and results and treatment plan

## 2019-03-05 NOTE — Patient Instructions (Signed)

## 2019-03-12 ENCOUNTER — Ambulatory Visit: Payer: 59 | Admitting: Internal Medicine

## 2019-03-18 ENCOUNTER — Other Ambulatory Visit: Payer: Self-pay | Admitting: Internal Medicine

## 2019-03-20 ENCOUNTER — Encounter: Payer: Self-pay | Admitting: Family Medicine

## 2019-03-20 ENCOUNTER — Ambulatory Visit: Payer: Self-pay

## 2019-03-20 ENCOUNTER — Other Ambulatory Visit: Payer: Self-pay

## 2019-03-20 ENCOUNTER — Ambulatory Visit: Payer: 59 | Admitting: Family Medicine

## 2019-03-20 VITALS — BP 130/90 | Ht 68.0 in | Wt 168.0 lb

## 2019-03-20 DIAGNOSIS — M25562 Pain in left knee: Secondary | ICD-10-CM

## 2019-03-23 NOTE — Progress Notes (Signed)
  Cameron White - 48 y.o. male MRN [id number]  Date of birth: Nov 24, 1970    SUBJECTIVE:      Chief Complaint:/ HPI:  Left knee pain several days. Started with acute pain while playing tennis 5 days ago. Continued to play. Some stiffness next day. Runs QOD and ran that day but had some pain afterward. Since I has remained somewhat stiff with occasional pain, mild swelling. Is concerned he may have injured it and wants to get it checked out    ROS:     No fever, no calf pain, no cough, no SOB  PERTINENT  PMH / PSH FH / / SH:  Past Medical, Surgical, Social, and Family History Reviewed & Updated in the EMR.  Pertinent findings include:  nonsmoker  OBJECTIVE: BP 130/90   Ht 5\' 8"  (1.727 m)   Wt 168 lb (76.2 kg)   BMI 25.54 kg/m   Physical Exam:  Vital signs are reviewed. GEN WD WN NAD KNEES: Bilaterally no joint lne tenderness to palpation. Right knee no effusion. Left knee small effusion. Both have FROm in flex and extension. Some stiffness but not pain with ful flexion of left. Negative MCMurray. Negative Thesally on left. Bith knees ligamentously intact to varus and valgus stress with normal Lachman. SKIN: no bruising and no rash noted on LLE.  VASC: DP oulses 2+B=. Neuro: intact sensation to soft touch B LE.  Left knee ultrasound: Small amount of fluid in suprapatelar pouch and seen at lateral and medial joint spaces. Menisci are intact with no sign of extrusion or tear.Quadricep and patellar tendons are intact with no sign of defect,  ASSESSMENT & PLAN:  LEFT knee pain: very small effusion. Suspect minor meniscal injury. Discussed options. He elected conservative care with follow up if no improvement or if worsening  Sx.

## 2019-04-20 ENCOUNTER — Other Ambulatory Visit: Payer: Self-pay | Admitting: Internal Medicine

## 2019-04-23 ENCOUNTER — Other Ambulatory Visit: Payer: Self-pay | Admitting: Internal Medicine

## 2019-04-23 MED ORDER — LOSARTAN POTASSIUM 50 MG PO TABS: 50.0000 mg | ORAL_TABLET | Freq: Every day | ORAL | 2 refills | Status: DC

## 2019-04-27 ENCOUNTER — Other Ambulatory Visit: Payer: Self-pay

## 2019-04-27 DIAGNOSIS — Z20822 Contact with and (suspected) exposure to covid-19: Secondary | ICD-10-CM

## 2019-04-29 LAB — NOVEL CORONAVIRUS, NAA: SARS-CoV-2, NAA: NOT DETECTED

## 2019-05-28 ENCOUNTER — Other Ambulatory Visit: Payer: Self-pay | Admitting: Internal Medicine

## 2019-07-21 ENCOUNTER — Other Ambulatory Visit: Payer: Self-pay | Admitting: Internal Medicine

## 2019-08-21 ENCOUNTER — Other Ambulatory Visit: Payer: Self-pay | Admitting: Internal Medicine

## 2019-08-24 ENCOUNTER — Other Ambulatory Visit: Payer: Self-pay | Admitting: Internal Medicine

## 2019-08-24 ENCOUNTER — Encounter: Payer: Self-pay | Admitting: Gastroenterology

## 2019-08-24 NOTE — Telephone Encounter (Signed)
    1.Medication Requested: Losartan  2. Pharmacy (Name, Street, [place]): [facility] [phone] - [place], [place] - [address] AT [facility] OF [address]  3. On Med List: yes  4. Last Visit with PCP: 03/05/19  5. Next visit date with PCP: 08/28/19   Agent: Please be advised that RX refills may take up to 3 business days. We ask that you follow-up with your pharmacy.

## 2019-08-25 MED ORDER — LOSARTAN POTASSIUM 50 MG PO TABS: ORAL_TABLET | ORAL | 0 refills | Status: DC

## 2019-08-25 NOTE — Telephone Encounter (Signed)
Per office policy sent 30 day to local pharmacy until appt.../lmb  

## 2019-08-28 ENCOUNTER — Ambulatory Visit (INDEPENDENT_AMBULATORY_CARE_PROVIDER_SITE_OTHER): Payer: 59 | Admitting: Internal Medicine

## 2019-08-28 ENCOUNTER — Other Ambulatory Visit: Payer: Self-pay

## 2019-08-28 ENCOUNTER — Encounter: Payer: Self-pay | Admitting: Internal Medicine

## 2019-08-28 VITALS — BP 134/88 | HR 56 | Temp 98.4°F | Ht 68.0 in | Wt 180.0 lb

## 2019-08-28 DIAGNOSIS — I10 Essential (primary) hypertension: Secondary | ICD-10-CM | POA: Diagnosis not present

## 2019-08-28 DIAGNOSIS — Z23 Encounter for immunization: Secondary | ICD-10-CM

## 2019-08-28 DIAGNOSIS — Z0000 Encounter for general adult medical examination without abnormal findings: Secondary | ICD-10-CM

## 2019-08-28 DIAGNOSIS — K219 Gastro-esophageal reflux disease without esophagitis: Secondary | ICD-10-CM | POA: Diagnosis not present

## 2019-08-28 LAB — CBC
HCT: 43.8 % (ref 39.0–52.0)
Hemoglobin: 15.5 g/dL (ref 13.0–17.0)
MCHC: 35.3 g/dL (ref 30.0–36.0)
MCV: 98.4 fl (ref 78.0–100.0)
Platelets: 173 10*3/uL (ref 150.0–400.0)
RBC: 4.45 Mil/uL (ref 4.22–5.81)
RDW: 13.2 % (ref 11.5–15.5)
WBC: 6.1 10*3/uL (ref 4.0–10.5)

## 2019-08-28 LAB — COMPREHENSIVE METABOLIC PANEL
ALT: 53 U/L (ref 0–53)
AST: 30 U/L (ref 0–37)
Albumin: 4 g/dL (ref 3.5–5.2)
Alkaline Phosphatase: 89 U/L (ref 39–117)
BUN: 13 mg/dL (ref 6–23)
CO2: 27 mEq/L (ref 19–32)
Calcium: 9 mg/dL (ref 8.4–10.5)
Chloride: 105 mEq/L (ref 96–112)
Creatinine, Ser: 0.78 mg/dL (ref 0.40–1.50)
GFR: 104.91 mL/min (ref >60.00)
Glucose, Bld: 95 mg/dL (ref 70–99)
Potassium: 3.7 mEq/L (ref 3.5–5.1)
Sodium: 137 mEq/L (ref 135–145)
Total Bilirubin: 0.7 mg/dL (ref 0.2–1.2)
Total Protein: 6.8 g/dL (ref 6.0–8.3)

## 2019-08-28 LAB — LIPID PANEL
Cholesterol: 161 mg/dL (ref 0–200)
HDL: 57.7 mg/dL (ref >39.00)
LDL Cholesterol: 82 mg/dL (ref 0–99)
NonHDL: 103.51
Total CHOL/HDL Ratio: 3
Triglycerides: 110 mg/dL (ref 0.0–149.0)
VLDL: 22 mg/dL (ref 0.0–40.0)

## 2019-08-28 MED ORDER — LOSARTAN POTASSIUM 50 MG PO TABS: ORAL_TABLET | ORAL | 3 refills | Status: DC

## 2019-08-28 MED ORDER — ESOMEPRAZOLE MAGNESIUM 40 MG PO CPDR: 40.0000 mg | DELAYED_RELEASE_CAPSULE | Freq: Every day | ORAL | 3 refills | Status: AC

## 2019-08-28 NOTE — Assessment & Plan Note (Signed)
Controlled with nexium and cough mostly resolved.

## 2019-08-28 NOTE — Assessment & Plan Note (Signed)
Flu shot up to date. Shingrix counseled. Tetanus given. Colonoscopy getting next month. Counseled about sun safety and mole surveillance. Counseled about the dangers of distracted driving. Given 10 year screening recommendations.

## 2019-08-28 NOTE — Patient Instructions (Signed)

## 2019-08-28 NOTE — Assessment & Plan Note (Signed)
BP at goal on losartan. Checking CMP and adjust as needed.  

## 2019-08-28 NOTE — Progress Notes (Signed)
   Subjective:   Patient ID: Cameron White, male    DOB: June 26, 1970, 49 y.o.   MRN: [id number]  HPI The patient is a 49 YO man coming in for physical.   PMH, FMH, social history reviewed and updated  Review of Systems  Constitutional: Negative.   HENT: Negative.   Eyes: Negative.   Respiratory: Negative for cough, chest tightness and shortness of breath.   Cardiovascular: Negative for chest pain, palpitations and leg swelling.  Gastrointestinal: Negative for abdominal distention, abdominal pain, constipation, diarrhea, nausea and vomiting.  Musculoskeletal: Negative.   Skin: Negative.   Neurological: Negative.   Psychiatric/Behavioral: Negative.     Objective:  Physical Exam Constitutional:      Appearance: He is well-developed.  HENT:     Head: Normocephalic and atraumatic.  Cardiovascular:     Rate and Rhythm: Normal rate and regular rhythm.  Pulmonary:     Effort: Pulmonary effort is normal. No respiratory distress.     Breath sounds: Normal breath sounds. No wheezing or rales.  Abdominal:     General: Bowel sounds are normal. There is no distension.     Palpations: Abdomen is soft.     Tenderness: There is no abdominal tenderness. There is no rebound.  Musculoskeletal:     Cervical back: Normal range of motion.  Skin:    General: Skin is warm and dry.  Neurological:     Mental Status: He is alert and oriented to person, place, and time.     Coordination: Coordination normal.     Vitals:   08/28/19 1310  BP: 134/88  Pulse: (!) 56  Temp: 98.4 F (36.9 C)  TempSrc: Oral  SpO2: 96%  Weight: 180 lb (81.6 kg)  Height: 5\' 8"  (1.727 m)    This visit occurred during the SARS-CoV-2 public health emergency.  Safety protocols were in place, including screening questions prior to the visit, additional usage of staff PPE, and extensive cleaning of exam room while observing appropriate contact time as indicated for disinfecting solutions.   Assessment & Plan:   Tdap given at visit

## 2019-09-09 ENCOUNTER — Other Ambulatory Visit: Payer: Self-pay

## 2019-09-09 ENCOUNTER — Ambulatory Visit (AMBULATORY_SURGERY_CENTER): Payer: Self-pay | Admitting: *Deleted

## 2019-09-09 VITALS — Temp 98.6°F | Ht 68.0 in | Wt 165.0 lb

## 2019-09-09 DIAGNOSIS — Z1211 Encounter for screening for malignant neoplasm of colon: Secondary | ICD-10-CM

## 2019-09-09 DIAGNOSIS — Z01818 Encounter for other preprocedural examination: Secondary | ICD-10-CM

## 2019-09-09 MED ORDER — NA SULFATE-K SULFATE-MG SULF 17.5-3.13-1.6 GM/177ML PO SOLN: 1.0000 | Freq: Once | ORAL | 0 refills | Status: AC

## 2019-09-09 NOTE — Progress Notes (Signed)

## 2019-09-18 ENCOUNTER — Other Ambulatory Visit: Payer: Self-pay | Admitting: Gastroenterology

## 2019-09-18 ENCOUNTER — Ambulatory Visit (INDEPENDENT_AMBULATORY_CARE_PROVIDER_SITE_OTHER): Payer: 59

## 2019-09-18 DIAGNOSIS — Z1159 Encounter for screening for other viral diseases: Secondary | ICD-10-CM

## 2019-09-18 LAB — SARS CORONAVIRUS 2 (TAT 6-24 HRS): SARS Coronavirus 2: NEGATIVE

## 2019-09-23 ENCOUNTER — Encounter: Payer: Self-pay | Admitting: Gastroenterology

## 2019-09-23 ENCOUNTER — Other Ambulatory Visit: Payer: Self-pay

## 2019-09-23 ENCOUNTER — Other Ambulatory Visit: Payer: Self-pay | Admitting: Internal Medicine

## 2019-09-23 ENCOUNTER — Ambulatory Visit (AMBULATORY_SURGERY_CENTER): Payer: 59 | Admitting: Gastroenterology

## 2019-09-23 DIAGNOSIS — Z1211 Encounter for screening for malignant neoplasm of colon: Secondary | ICD-10-CM

## 2019-09-23 MED ORDER — SODIUM CHLORIDE 0.9 % IV SOLN: 500.0000 mL | Freq: Once | INTRAVENOUS | Status: DC

## 2019-09-23 NOTE — Op Note (Signed)
[facility] Patient Name: Cameron White Procedure Date: 09/23/2019 10:28 AM MRN: [id number] Endoscopist: Napoleon Form , MD Age: 49 Referring MD:  Date of Birth: [birth year] Gender: Male Account #: [id number] Procedure:                Colonoscopy Indications:              Screening for colorectal malignant neoplasm Medicines:                Monitored Anesthesia Care Procedure:                Pre-Anesthesia Assessment:                           - Prior to the procedure, a History and Physical                            was performed, and patient medications and                            allergies were reviewed. The patient's tolerance of                            previous anesthesia was also reviewed. The risks                            and benefits of the procedure and the sedation                            options and risks were discussed with the patient.                            All questions were answered, and informed consent                            was obtained. Prior Anticoagulants: The patient has                            taken no previous anticoagulant or antiplatelet                            agents. ASA Grade Assessment: II - A patient with                            mild systemic disease. After reviewing the risks                            and benefits, the patient was deemed in                            satisfactory condition to undergo the procedure.                           After obtaining informed consent, the colonoscope  was passed under direct vision. Throughout the                            procedure, the patient's blood pressure, pulse, and                            oxygen saturations were monitored continuously. The                            Colonoscope was introduced through the anus and                            advanced to the the cecum, identified by                            appendiceal orifice  and ileocecal valve. The                            colonoscopy was performed without difficulty. The                            patient tolerated the procedure well. The quality                            of the bowel preparation was excellent. The                            ileocecal valve, appendiceal orifice, and rectum                            were photographed. Scope In: 10:31:11 AM Scope Out: 10:43:54 AM Scope Withdrawal Time: 0 hours 9 minutes 47 seconds  Total Procedure Duration: 0 hours 12 minutes 43 seconds  Findings:                 The perianal and digital rectal examinations were                            normal.                           A few small-mouthed diverticula were found in the                            sigmoid colon.                           Non-bleeding internal hemorrhoids were found during                            retroflexion. The hemorrhoids were small.                           The exam was otherwise without abnormality. Complications:            No immediate complications. Estimated Blood Loss:  Estimated blood loss: none. Impression:               - Diverticulosis in the sigmoid colon.                           - Non-bleeding internal hemorrhoids.                           - The examination was otherwise normal.                           - No specimens collected. Recommendation:           - Patient has a contact number available for                            emergencies. The signs and symptoms of potential                            delayed complications were discussed with the                            patient. Return to normal activities tomorrow.                            Written discharge instructions were provided to the                            patient.                           - Resume previous diet.                           - Continue present medications.                           - Repeat colonoscopy in 10 years for screening                             purposes. Mauri Pole, MD 09/23/2019 10:52:16 AM This report has been signed electronically.

## 2019-09-23 NOTE — Progress Notes (Signed)
Temp by LC Vitals by DT 

## 2019-09-23 NOTE — Progress Notes (Signed)
Report given to PACU, vss 

## 2019-09-23 NOTE — Patient Instructions (Signed)
HANDOUTS PROVIDED ON: DIVERTICULOSIS & HEMORRHOIDS  You may resume your previous diet and medication schedule.  Thank you for allowing us to care for you today!!!   YOU HAD AN ENDOSCOPIC PROCEDURE TODAY AT THE [facility]:   Refer to the procedure report that was given to you for any specific questions about what was found during the examination.  If the procedure report does not answer your questions, please call your gastroenterologist to clarify.  If you requested that your care partner not be given the details of your procedure findings, then the procedure report has been included in a sealed envelope for you to review at your convenience later.  YOU SHOULD EXPECT: Some feelings of bloating in the abdomen. Passage of more gas than usual.  Walking can help get rid of the air that was put into your GI tract during the procedure and reduce the bloating. If you had a lower endoscopy (such as a colonoscopy or flexible sigmoidoscopy) you may notice spotting of blood in your stool or on the toilet paper. If you underwent a bowel prep for your procedure, you may not have a normal bowel movement for a few days.  Please Note:  You might notice some irritation and congestion in your nose or some drainage.  This is from the oxygen used during your procedure.  There is no need for concern and it should clear up in a day or so.  SYMPTOMS TO REPORT IMMEDIATELY:   Following lower endoscopy (colonoscopy or flexible sigmoidoscopy):  Excessive amounts of blood in the stool  Significant tenderness or worsening of abdominal pains  Swelling of the abdomen that is new, acute  Fever of 100F or higher  For urgent or emergent issues, a gastroenterologist can be reached at any hour by calling [phone]. Do not use MyChart messaging for urgent concerns.    DIET:  We do recommend a small meal at first, but then you may proceed to your regular diet.  Drink plenty of fluids but you should avoid  alcoholic beverages for 24 hours.  ACTIVITY:  You should plan to take it easy for the rest of today and you should NOT DRIVE or use heavy machinery until tomorrow (because of the sedation medicines used during the test).    FOLLOW UP: Our staff will call the number listed on your records 48-72 hours following your procedure to check on you and address any questions or concerns that you may have regarding the information given to you following your procedure. If we do not reach you, we will leave a message.  We will attempt to reach you two times.  During this call, we will ask if you have developed any symptoms of COVID 19. If you develop any symptoms (ie: fever, flu-like symptoms, shortness of breath, cough etc.) before then, please call [phone].  If you test positive for Covid 19 in the 2 weeks post procedure, please call and report this information to us.    If any biopsies were taken you will be contacted by phone or by letter within the next 1-3 weeks.  Please call us at [phone] if you have not heard about the biopsies in 3 weeks.    SIGNATURES/CONFIDENTIALITY: You and/or your care partner have signed paperwork which will be entered into your electronic medical record.  These signatures attest to the fact that that the information above on your After Visit Summary has been reviewed and is understood.  Full responsibility of the confidentiality   of this discharge information lies with you and/or your care-partner. 

## 2019-09-25 ENCOUNTER — Telehealth: Payer: Self-pay | Admitting: *Deleted

## 2019-09-25 NOTE — Telephone Encounter (Signed)
Message left

## 2019-09-25 NOTE — Telephone Encounter (Signed)
No answer for post procedure call back. Left message for patient and will call back later this afternoon. 

## 2019-11-11 ENCOUNTER — Encounter: Payer: Self-pay | Admitting: Internal Medicine

## 2019-11-12 ENCOUNTER — Ambulatory Visit: Payer: 59 | Admitting: Sports Medicine

## 2019-11-18 ENCOUNTER — Telehealth: Payer: Self-pay | Admitting: Gastroenterology

## 2019-11-18 NOTE — Telephone Encounter (Signed)
Ok, thank you for letting me know Cameron White. I will see him for follow up soon.  Beth, can you please schedule office visit? Thank you

## 2019-11-18 NOTE — Telephone Encounter (Signed)
Tyrone Nine, I know Cameron White from the [employer] which we both belong to.  He reached out to me with some GERD-like symptoms.  He has been on proton pump inhibitor for years mainly for chronic coughing, only mild heartburn.  Nexium, he believes 40 mg, shortly before breakfast every morning.  His symptoms have gotten worse lately.  No dysphagia. No weight loss.  I recommended that he add Pepcid 20 mg at bedtime every night especially since a lot of his coughing issues are in the early morning.  I told him I would reach out to you to see about getting him in for an office visit in the next few weeks to discuss further, consider other testing or treatment.  Thanks   DJ

## 2019-11-18 NOTE — Telephone Encounter (Signed)
Pt scheduled to see Dr. Lavon Paganini 12/21/19@3 :50pm. Appt letter mailed to pt.

## 2019-11-21 ENCOUNTER — Other Ambulatory Visit: Payer: Self-pay | Admitting: Internal Medicine

## 2019-11-23 ENCOUNTER — Other Ambulatory Visit: Payer: Self-pay | Admitting: Internal Medicine

## 2019-12-21 ENCOUNTER — Ambulatory Visit: Payer: 59 | Admitting: Gastroenterology

## 2019-12-21 ENCOUNTER — Encounter: Payer: Self-pay | Admitting: Gastroenterology

## 2019-12-21 VITALS — BP 116/80 | HR 56 | Ht 66.0 in | Wt 174.4 lb

## 2019-12-21 DIAGNOSIS — K219 Gastro-esophageal reflux disease without esophagitis: Secondary | ICD-10-CM

## 2019-12-21 DIAGNOSIS — R053 Chronic cough: Secondary | ICD-10-CM

## 2019-12-21 DIAGNOSIS — R05 Cough: Secondary | ICD-10-CM

## 2019-12-21 MED ORDER — FAMOTIDINE 20 MG PO TABS: 20.0000 mg | ORAL_TABLET | Freq: Every day | ORAL | 3 refills | Status: AC

## 2019-12-21 NOTE — Progress Notes (Signed)
Cameron White    [id number]    [birth year]  Primary Care Physician:Russo, Jonny Ruiz, MD  Referring Physician: Myrlene Broker, MD [address] [place],  [place] [address]   Chief complaint: Cough, GERD  HPI: 49 year old male here for follow-up visit with worsening cough in the past 6 months, somewhat better since he started taking Pepcid at bedtime .  He has been taking PPI for past 10 years and has been on Nexium daily for last 2 to 3 years.  Reflux symptoms especially cough significantly worse in the past 5 to 6 months.  He gets bouts of severe cough that can sometimes make him dizzy .  He has history of seasonal allergies and also exercise-induced asthma, uses Flonase, Symbicort and albuterol as needed   He modified his diet, currently only drinks decaffeinated coffee and also decreased alcohol intake.  He no longer drinks soda other than occasional ginger ale or Sprite.    He has gained weight in the past few years .  Colonoscopy September 23, 2019: Sigmoid diverticulosis and internal hemorrhoids  Transnasal Flexible Laryngoscopy with Videostroboscopy July 14, 2018 by Dr. Delford Field at [facility] The exam reveals a larynx with: Mild diffuse erythema Modest posterior laryngeal edema The vocal fold mobility is preserved There is minimal midfold atrophy >L At the point of vocal process to vocal process contact, there is a 1-2 mm glottal gap Muscle tension patterns II and III allow for improved glottal closure (an antero-posterior and lateral "squeeze") Mucosal vibration is minimally diminished bilaterally There are no lesions on the free edge of the vocal fold, or elsewhere in the larynx worrisome for malignancy The mucosa of the post-cricoid and interarytenoid region is modestly redundant There are no secretions pooled in either pyriform sinus, and no aspiration is identified on this examination The tongue base and epiglottis are structurally  normal The visualized subglottis and proximal trachea are widely patent   Outpatient Encounter Medications as of 12/21/2019  Medication Sig  . albuterol (VENTOLIN HFA) 108 (90 Base) MCG/ACT inhaler INHALE 2 PUFFS INTO THE LUNGS EVERY 6 HOURS AS NEEDED FOR WHEEZING OR SHORTNESS OF BREATH  . esomeprazole (NEXIUM) 40 MG capsule Take 1 capsule (40 mg total) by mouth daily at 12 noon.  . fluticasone (FLONASE) 50 MCG/ACT nasal spray Place 2 sprays into both nostrils daily.  Marland Kitchen ketoconazole (NIZORAL) 2 % cream APP AA BID  . losartan (COZAAR) 50 MG tablet TAKE 1 TABLET(50 MG) BY MOUTH DAILY  . nitroGLYCERIN (NITRODUR - DOSED IN MG/24 HR) 0.2 mg/hr patch Use 1/4 patch daily to the affected area.  . sildenafil (VIAGRA) 100 MG tablet Take 0.5-1 tablets (50-100 mg total) by mouth daily as needed for erectile dysfunction.  . SYMBICORT 160-4.5 MCG/ACT inhaler INHALE 2 PUFFS INTO THE LUNGS TWICE DAILY   No facility-administered encounter medications on file as of 12/21/2019.    Allergies as of 12/21/2019  . (No Known Allergies)    Past Medical History:  Diagnosis Date  . Achilles tendinitis on left   . ASTHMA   . Asthma   . GERD   . HALLUX RIGIDUS, ACQUIRED   . History of knee surgery 04/03/2017  . Hypertension   . Patellar tendinitis   . SACROILIAC JOINT DYSFUNCTION     Past Surgical History:  Procedure Laterality Date  . bone spurs Bilateral 03/2017   bone spurs removed  . NO PAST SURGERIES  Family History  Problem Relation Age of Onset  . Hypertension Father   . Heart disease Paternal Grandfather   . Hypertension Paternal Grandfather   . Alcohol abuse Other        parent & grandparent  . Hypertension Other   . Colon cancer Neg Hx   . Esophageal cancer Neg Hx   . Stomach cancer Neg Hx   . Rectal cancer Neg Hx     Social History   Socioeconomic History  . Marital status: Married    Spouse name: Not on file  . Number of children: Not on file  . Years of education: Not  on file  . Highest education level: Not on file  Occupational History  . Not on file  Tobacco Use  . Smoking status: Never Smoker  . Smokeless tobacco: Never Used  Substance and Sexual Activity  . Alcohol use: Yes    Alcohol/week: 4.0 standard drinks    Types: 4 Standard drinks or equivalent per week    Comment: occasional  . Drug use: No  . Sexual activity: Not on file  Other Topics Concern  . Not on file  Social History Narrative  . Not on file   Social Determinants of Health   Financial Resource Strain:   . Difficulty of Paying Living Expenses:   Food Insecurity:   . Worried About Programme researcher, broadcasting/film/video in the Last Year:   . Barista in the Last Year:   Transportation Needs:   . Freight forwarder (Medical):   Marland Kitchen Lack of Transportation (Non-Medical):   Physical Activity:   . Days of Exercise per Week:   . Minutes of Exercise per Session:   Stress:   . Feeling of Stress :   Social Connections:   . Frequency of Communication with Friends and Family:   . Frequency of Social Gatherings with Friends and Family:   . Attends Religious Services:   . Active Member of Clubs or Organizations:   . Attends Banker Meetings:   Marland Kitchen Marital Status:   Intimate Partner Violence:   . Fear of Current or Ex-Partner:   . Emotionally Abused:   Marland Kitchen Physically Abused:   . Sexually Abused:       Review of systems: All other review of systems negative except as mentioned in the HPI.   Physical Exam: Vitals:   12/21/19 1542  BP: 116/80  Pulse: (!) 56   Body mass index is 28.14 kg/m. Gen:      No acute distress HEENT:  sclera anicteric Neuro: alert and oriented x 3 Psych: normal mood and affect  Data Reviewed:  Reviewed labs, radiology imaging, old records and pertinent past GI work up   Assessment and Plan/Recommendations:  49 year old male with longstanding history of GERD with recent worsening cough likely secondary to LPR  Recurrent GERD symptoms  despite PPI use Schedule for EGD to evaluate for hiatal hernia, erosive esophagitis  Continue Nexium daily and Pepcid at bedtime as needed Discussed antireflux measures and lifestyle modifications  Discussed potential adverse drug reaction with long-term PPI use, may consider antireflux surgery/TIF based on findings on EGD  The risks and benefits as well as alternatives of endoscopic procedure(s) have been discussed and reviewed. All questions answered. The patient agrees to proceed.    The patient was provided an opportunity to ask questions and all were answered. The patient agreed with the plan and demonstrated an understanding of the instructions.  Iona Beard ,  MD    CC: Myrlene Broker, *

## 2019-12-21 NOTE — Patient Instructions (Signed)
You have been scheduled for an endoscopy. Please follow written instructions given to you at your visit today. If you use inhalers (even only as needed), please bring them with you on the day of your procedure. Your physician has requested that you go to www.startemmi.com and enter the access code given to you at your visit today. This web site gives a general overview about your procedure. However, you should still follow specific instructions given to you by our office regarding your preparation for the procedure.  We will send Nexium and Pepcid to your pharmacy   Gastroesophageal Reflux Disease, Adult Gastroesophageal reflux (GER) happens when acid from the stomach flows up into the tube that connects the mouth and the stomach (esophagus). Normally, food travels down the esophagus and stays in the stomach to be digested. However, when a person has GER, food and stomach acid sometimes move back up into the esophagus. If this becomes a more serious problem, the person may be diagnosed with a disease called gastroesophageal reflux disease (GERD). GERD occurs when the reflux:  Happens often.  Causes frequent or severe symptoms.  Causes problems such as damage to the esophagus. When stomach acid comes in contact with the esophagus, the acid may cause soreness (inflammation) in the esophagus. Over time, GERD may create small holes (ulcers) in the lining of the esophagus. What are the causes? This condition is caused by a problem with the muscle between the esophagus and the stomach (lower esophageal sphincter, or LES). Normally, the LES muscle closes after food passes through the esophagus to the stomach. When the LES is weakened or abnormal, it does not close properly, and that allows food and stomach acid to go back up into the esophagus. The LES can be weakened by certain dietary substances, medicines, and medical conditions, including:  Tobacco use.  Pregnancy.  Having a hiatal  hernia.  Alcohol use.  Certain foods and beverages, such as coffee, chocolate, onions, and peppermint. What increases the risk? You are more likely to develop this condition if you:  Have an increased body weight.  Have a connective tissue disorder.  Use NSAID medicines. What are the signs or symptoms? Symptoms of this condition include:  Heartburn.  Difficult or painful swallowing.  The feeling of having a lump in the throat.  Abitter taste in the mouth.  Bad breath.  Having a large amount of saliva.  Having an upset or bloated stomach.  Belching.  Chest pain. Different conditions can cause chest pain. Make sure you see your health care provider if you experience chest pain.  Shortness of breath or wheezing.  Ongoing (chronic) cough or a night-time cough.  Wearing away of tooth enamel.  Weight loss. How is this diagnosed? Your health care provider will take a medical history and perform a physical exam. To determine if you have mild or severe GERD, your health care provider may also monitor how you respond to treatment. You may also have tests, including:  A test to examine your stomach and esophagus with a small camera (endoscopy).  A test thatmeasures the acidity level in your esophagus.  A test thatmeasures how much pressure is on your esophagus.  A barium swallow or modified barium swallow test to show the shape, size, and functioning of your esophagus. How is this treated? The goal of treatment is to help relieve your symptoms and to prevent complications. Treatment for this condition may vary depending on how severe your symptoms are. Your health care provider may recommend:  Changes to your diet.  Medicine.  Surgery. Follow these instructions at home: Eating and drinking   Follow a diet as recommended by your health care provider. This may involve avoiding foods and drinks such as: ? Coffee and tea (with or without caffeine). ? Drinks that  containalcohol. ? Energy drinks and sports drinks. ? Carbonated drinks or sodas. ? Chocolate and cocoa. ? Peppermint and mint flavorings. ? Garlic and onions. ? Horseradish. ? Spicy and acidic foods, including peppers, chili powder, curry powder, vinegar, hot sauces, and barbecue sauce. ? Citrus fruit juices and citrus fruits, such as oranges, lemons, and limes. ? Tomato-based foods, such as red sauce, chili, salsa, and pizza with red sauce. ? Fried and fatty foods, such as donuts, french fries, potato chips, and high-fat dressings. ? High-fat meats, such as hot dogs and fatty cuts of red and white meats, such as rib eye steak, sausage, ham, and bacon. ? High-fat dairy items, such as whole milk, butter, and cream cheese.  Eat small, frequent meals instead of large meals.  Avoid drinking large amounts of liquid with your meals.  Avoid eating meals during the 2-3 hours before bedtime.  Avoid lying down right after you eat.  Do not exercise right after you eat. Lifestyle   Do not use any products that contain nicotine or tobacco, such as cigarettes, e-cigarettes, and chewing tobacco. If you need help quitting, ask your health care provider.  Try to reduce your stress by using methods such as yoga or meditation. If you need help reducing stress, ask your health care provider.  If you are overweight, reduce your weight to an amount that is healthy for you. Ask your health care provider for guidance about a safe weight loss goal. General instructions  Pay attention to any changes in your symptoms.  Take over-the-counter and prescription medicines only as told by your health care provider. Do not take aspirin, ibuprofen, or other NSAIDs unless your health care provider told you to do so.  Wear loose-fitting clothing. Do not wear anything tight around your waist that causes pressure on your abdomen.  Raise (elevate) the head of your bed about 6 inches (15 cm).  Avoid bending over if  this makes your symptoms worse.  Keep all follow-up visits as told by your health care provider. This is important. Contact a health care provider if:  You have: ? New symptoms. ? Unexplained weight loss. ? Difficulty swallowing or it hurts to swallow. ? Wheezing or a persistent cough. ? A hoarse voice.  Your symptoms do not improve with treatment. Get help right away if you:  Have pain in your arms, neck, jaw, teeth, or back.  Feel sweaty, dizzy, or light-headed.  Have chest pain or shortness of breath.  Vomit and your vomit looks like blood or coffee grounds.  Faint.  Have stool that is bloody or black.  Cannot swallow, drink, or eat. Summary  Gastroesophageal reflux happens when acid from the stomach flows up into the esophagus. GERD is a disease in which the reflux happens often, causes frequent or severe symptoms, or causes problems such as damage to the esophagus.  Treatment for this condition may vary depending on how severe your symptoms are. Your health care provider may recommend diet and lifestyle changes, medicine, or surgery.  Contact a health care provider if you have new or worsening symptoms.  Take over-the-counter and prescription medicines only as told by your health care provider. Do not take aspirin, ibuprofen, or other  NSAIDs unless your health care provider told you to do so.  Keep all follow-up visits as told by your health care provider. This is important. This information is not intended to replace advice given to you by your health care provider. Make sure you discuss any questions you have with your health care provider. Document Revised: 12/04/2017 Document Reviewed: 12/04/2017 Elsevier Patient Education  2020 [employer].   Food Choices for Gastroesophageal Reflux Disease, Adult When you have gastroesophageal reflux disease (GERD), the foods you eat and your eating habits are very important. Choosing the right foods can help ease your  discomfort. Think about working with a nutrition specialist (dietitian) to help you make good choices. What are tips for following this plan?  Meals  Choose healthy foods that are low in fat, such as fruits, vegetables, whole grains, low-fat dairy products, and lean meat, fish, and poultry.  Eat small meals often instead of 3 large meals a day. Eat your meals slowly, and in a place where you are relaxed. Avoid bending over or lying down until 2-3 hours after eating.  Avoid eating meals 2-3 hours before bed.  Avoid drinking a lot of liquid with meals.  Cook foods using methods other than frying. Bake, grill, or broil food instead.  Avoid or limit: ? Chocolate. ? Peppermint or spearmint. ? Alcohol. ? Pepper. ? Black and decaffeinated coffee. ? Black and decaffeinated tea. ? Bubbly (carbonated) soft drinks. ? Caffeinated energy drinks and soft drinks.  Limit high-fat foods such as: ? Fatty meat or fried foods. ? Whole milk, cream, butter, or ice cream. ? Nuts and nut butters. ? Pastries, donuts, and sweets made with butter or shortening.  Avoid foods that cause symptoms. These foods may be different for everyone. Common foods that cause symptoms include: ? Tomatoes. ? Oranges, lemons, and limes. ? Peppers. ? Spicy food. ? Onions and garlic. ? Vinegar. Lifestyle  Maintain a healthy weight. Ask your doctor what weight is healthy for you. If you need to lose weight, work with your doctor to do so safely.  Exercise for at least 30 minutes for 5 or more days each week, or as told by your doctor.  Wear loose-fitting clothes.  Do not smoke. If you need help quitting, ask your doctor.  Sleep with the head of your bed higher than your feet. Use a wedge under the mattress or blocks under the bed frame to raise the head of the bed. Summary  When you have gastroesophageal reflux disease (GERD), food and lifestyle choices are very important in easing your symptoms.  Eat small  meals often instead of 3 large meals a day. Eat your meals slowly, and in a place where you are relaxed.  Limit high-fat foods such as fatty meat or fried foods.  Avoid bending over or lying down until 2-3 hours after eating.  Avoid peppermint and spearmint, caffeine, alcohol, and chocolate. This information is not intended to replace advice given to you by your health care provider. Make sure you discuss any questions you have with your health care provider. Document Revised: 09/18/2018 Document Reviewed: 07/03/2016 Elsevier Patient Education  2020 [employer].   I appreciate the  opportunity to care for you  Thank You   Marsa Aris , MD

## 2019-12-22 ENCOUNTER — Other Ambulatory Visit: Payer: Self-pay | Admitting: Internal Medicine

## 2019-12-22 DIAGNOSIS — Z0000 Encounter for general adult medical examination without abnormal findings: Secondary | ICD-10-CM

## 2019-12-22 DIAGNOSIS — I10 Essential (primary) hypertension: Secondary | ICD-10-CM

## 2019-12-23 ENCOUNTER — Ambulatory Visit: Payer: 59 | Admitting: Family Medicine

## 2019-12-23 ENCOUNTER — Encounter: Payer: Self-pay | Admitting: Gastroenterology

## 2019-12-23 ENCOUNTER — Other Ambulatory Visit: Payer: Self-pay

## 2019-12-23 ENCOUNTER — Encounter: Payer: Self-pay | Admitting: Family Medicine

## 2019-12-23 VITALS — BP 124/84 | Ht 66.0 in | Wt 170.0 lb

## 2019-12-23 DIAGNOSIS — S76302A Unspecified injury of muscle, fascia and tendon of the posterior muscle group at thigh level, left thigh, initial encounter: Secondary | ICD-10-CM

## 2019-12-23 NOTE — Progress Notes (Signed)
PCP: Creola Corn, MD  Subjective:   HPI: Patient is a 49 y.o. male here for left hamstring injury.  Patient reports about 2 weeks ago he was running on level ground when he felt a twinge left medial hamstring but was able to complete his run.   Took a little time off from running then last Wednesday felt like hamstring seized up in same spot. Then finally yesterday tried to run at [facility] and couldn't even finish a lap due to pain and seizing up in this area. No bruising or swelling. No prior injuries to this. He did fall on paddleboard at beach prior to this landing on right side but did not have injury to left side/hamstring. No numbness.  Past Medical History:  Diagnosis Date  . Achilles tendinitis on left   . ASTHMA   . Asthma   . GERD   . HALLUX RIGIDUS, ACQUIRED   . History of knee surgery 04/03/2017  . Hypertension   . Patellar tendinitis   . SACROILIAC JOINT DYSFUNCTION     Current Outpatient Medications on File Prior to Visit  Medication Sig Dispense Refill  . albuterol (VENTOLIN HFA) 108 (90 Base) MCG/ACT inhaler INHALE 2 PUFFS INTO THE LUNGS EVERY 6 HOURS AS NEEDED FOR WHEEZING OR SHORTNESS OF BREATH 6.7 g 0  . esomeprazole (NEXIUM) 40 MG capsule Take 1 capsule (40 mg total) by mouth daily at 12 noon. 90 capsule 3  . famotidine (PEPCID) 20 MG tablet Take 1 tablet (20 mg total) by mouth at bedtime. 30 tablet 3  . fluticasone (FLONASE) 50 MCG/ACT nasal spray Place 2 sprays into both nostrils daily. 16 g 6  . ketoconazole (NIZORAL) 2 % cream APP AA BID  2  . losartan (COZAAR) 50 MG tablet TAKE 1 TABLET(50 MG) BY MOUTH DAILY 30 tablet 5  . nitroGLYCERIN (NITRODUR - DOSED IN MG/24 HR) 0.2 mg/hr patch Use 1/4 patch daily to the affected area. 30 patch 0  . sildenafil (VIAGRA) 100 MG tablet Take 0.5-1 tablets (50-100 mg total) by mouth daily as needed for erectile dysfunction. 5 tablet 11  . SYMBICORT 160-4.5 MCG/ACT inhaler INHALE 2 PUFFS INTO THE LUNGS TWICE DAILY 10.2 g 0    No current facility-administered medications on file prior to visit.    Past Surgical History:  Procedure Laterality Date  . bone spurs Bilateral 03/2017   bone spurs removed  . NO PAST SURGERIES      No Known Allergies  Social History   Socioeconomic History  . Marital status: Married    Spouse name: Not on file  . Number of children: Not on file  . Years of education: Not on file  . Highest education level: Not on file  Occupational History  . Not on file  Tobacco Use  . Smoking status: Never Smoker  . Smokeless tobacco: Never Used  Substance and Sexual Activity  . Alcohol use: Yes    Alcohol/week: 4.0 standard drinks    Types: 4 Standard drinks or equivalent per week    Comment: occasional  . Drug use: No  . Sexual activity: Not on file  Other Topics Concern  . Not on file  Social History Narrative  . Not on file   Social Determinants of Health   Financial Resource Strain:   . Difficulty of Paying Living Expenses:   Food Insecurity:   . Worried About Programme researcher, broadcasting/film/video in the Last Year:   . [employer] of Food in the Last Year:  Transportation Needs:   . Freight forwarder (Medical):   Marland Kitchen Lack of Transportation (Non-Medical):   Physical Activity:   . Days of Exercise per Week:   . Minutes of Exercise per Session:   Stress:   . Feeling of Stress :   Social Connections:   . Frequency of Communication with Friends and Family:   . Frequency of Social Gatherings with Friends and Family:   . Attends Religious Services:   . Active Member of Clubs or Organizations:   . Attends Banker Meetings:   Marland Kitchen Marital Status:   Intimate Partner Violence:   . Fear of Current or Ex-Partner:   . Emotionally Abused:   Marland Kitchen Physically Abused:   . Sexually Abused:     Family History  Problem Relation Age of Onset  . Hypertension Father   . Heart disease Paternal Grandfather   . Hypertension Paternal Grandfather   . Alcohol abuse Other        parent &  grandparent  . Hypertension Other   . Colon cancer Neg Hx   . Esophageal cancer Neg Hx   . Stomach cancer Neg Hx   . Rectal cancer Neg Hx     BP 124/84   Ht 5\' 6"  (1.676 m)   Wt 170 lb (77.1 kg)   BMI 27.44 kg/m   Review of Systems: See HPI above.     Objective:  Physical Exam:  Gen: NAD, comfortable in exam room  Left leg: No deformity, swelling, bruising, defect. FROM with 5/5 strength except 4/5 with resisted knee flexion at 90 and 30 degrees.  More pain at 30 degrees. Tender to palpation medial hamstring mid-proximal. NVI distally.   Limited MSK u/s left hamstring: Partial tear noted in area of pain within medial hamstring musculature superficially.  Small amount of edema surrounding musculature.  Assessment & Plan:  1. Left hamstring strain - Low grade 2 strain.  Compression sleeve.  Ice or heat.  Rest from running for next 2 weeks - cross train with cycling.  Home exercises reviewed and how to advance these as pain allows.  F/u in 2 weeks.

## 2019-12-23 NOTE — Patient Instructions (Signed)
You have a hamstring strain. Wear compression sleeve when up and walking around for next 6 weeks if tolerated. Aleve 2 tabs twice a day with food OR ibuprofen 600mg  three times a day with food only if needed. Ice or Heat 15 minutes at a time 3-4 times a day. Home exercises as directed. Consider physical therapy as well. Follow up with me in 2 weeks. Cross train with cycling in the meantime.

## 2019-12-25 ENCOUNTER — Other Ambulatory Visit: Payer: Self-pay

## 2019-12-25 ENCOUNTER — Encounter: Payer: Self-pay | Admitting: Gastroenterology

## 2019-12-25 ENCOUNTER — Ambulatory Visit (AMBULATORY_SURGERY_CENTER): Payer: 59 | Admitting: Gastroenterology

## 2019-12-25 VITALS — BP 123/81 | HR 57 | Temp 96.8°F | Resp 20 | Ht 66.0 in | Wt 174.0 lb

## 2019-12-25 DIAGNOSIS — K317 Polyp of stomach and duodenum: Secondary | ICD-10-CM

## 2019-12-25 DIAGNOSIS — K219 Gastro-esophageal reflux disease without esophagitis: Secondary | ICD-10-CM

## 2019-12-25 MED ORDER — SODIUM CHLORIDE 0.9 % IV SOLN: 500.0000 mL | Freq: Once | INTRAVENOUS | Status: DC

## 2019-12-25 NOTE — Patient Instructions (Signed)
Impression/Recommendaitons:  GERD Handout given to patient.  Resume previous diet. Continue present medications. Await pathology results.  Follow antireflux regimen.  Schedule esophageal manometry and 24 hr. PH impedance study.  Follow-up visit with Dr. Barron Alvine to discuss possible TIF for antireflux procedure.  YOU HAD AN ENDOSCOPIC PROCEDURE TODAY AT THE [facility]:   Refer to the procedure report that was given to you for any specific questions about what was found during the examination.  If the procedure report does not answer your questions, please call your gastroenterologist to clarify.  If you requested that your care partner not be given the details of your procedure findings, then the procedure report has been included in a sealed envelope for you to review at your convenience later.  YOU SHOULD EXPECT: Some feelings of bloating in the abdomen. Passage of more gas than usual.  Walking can help get rid of the air that was put into your GI tract during the procedure and reduce the bloating. If you had a lower endoscopy (such as a colonoscopy or flexible sigmoidoscopy) you may notice spotting of blood in your stool or on the toilet paper. If you underwent a bowel prep for your procedure, you may not have a normal bowel movement for a few days.  Please Note:  You might notice some irritation and congestion in your nose or some drainage.  This is from the oxygen used during your procedure.  There is no need for concern and it should clear up in a day or so.  SYMPTOMS TO REPORT IMMEDIATELY:  Following upper endoscopy (EGD)  Vomiting of blood or coffee ground material  New chest pain or pain under the shoulder blades  Painful or persistently difficult swallowing  New shortness of breath  Fever of 100F or higher  Black, tarry-looking stools  For urgent or emergent issues, a gastroenterologist can be reached at any hour by calling [phone]. Do not use MyChart  messaging for urgent concerns.    DIET:  We do recommend a small meal at first, but then you may proceed to your regular diet.  Drink plenty of fluids but you should avoid alcoholic beverages for 24 hours.  ACTIVITY:  You should plan to take it easy for the rest of today and you should NOT DRIVE or use heavy machinery until tomorrow (because of the sedation medicines used during the test).    FOLLOW UP: Our staff will call the number listed on your records 48-72 hours following your procedure to check on you and address any questions or concerns that you may have regarding the information given to you following your procedure. If we do not reach you, we will leave a message.  We will attempt to reach you two times.  During this call, we will ask if you have developed any symptoms of COVID 19. If you develop any symptoms (ie: fever, flu-like symptoms, shortness of breath, cough etc.) before then, please call [phone].  If you test positive for Covid 19 in the 2 weeks post procedure, please call and report this information to [place].    If any biopsies were taken you will be contacted by phone or by letter within the next 1-3 weeks.  Please call us at [phone] if you have not heard about the biopsies in 3 weeks.    SIGNATURES/CONFIDENTIALITY: You and/or your care partner have signed paperwork which will be entered into your electronic medical record.  These signatures attest to the fact that that  the information above on your After Visit Summary has been reviewed and is understood.  Full responsibility of the confidentiality of this discharge information lies with you and/or your care-partner.

## 2019-12-25 NOTE — Progress Notes (Signed)
PT taken to PACU. Monitors in place. VSS. Report given to RN. 

## 2019-12-25 NOTE — Op Note (Signed)
[facility] Patient Name: Cameron White Procedure Date: 12/25/2019 9:11 AM MRN: [id number] Endoscopist: Napoleon Form , MD Age: 49 Referring MD:  Date of Birth: [birth year] Gender: Male Account #: [id number] Procedure:                Upper GI endoscopy Indications:              Esophageal reflux symptoms that persist despite                            appropriate therapy, Esophageal reflux symptoms                            that recur despite appropriate therapy, Chronic                            cough Medicines:                Monitored Anesthesia Care Procedure:                Pre-Anesthesia Assessment:                           - Prior to the procedure, a History and Physical                            was performed, and patient medications and                            allergies were reviewed. The patient's tolerance of                            previous anesthesia was also reviewed. The risks                            and benefits of the procedure and the sedation                            options and risks were discussed with the patient.                            All questions were answered, and informed consent                            was obtained. Prior Anticoagulants: The patient has                            taken no previous anticoagulant or antiplatelet                            agents. ASA Grade Assessment: II - A patient with                            mild systemic disease. After reviewing the risks  and benefits, the patient was deemed in                            satisfactory condition to undergo the procedure.                           After obtaining informed consent, the endoscope was                            passed under direct vision. Throughout the                            procedure, the patient's blood pressure, pulse, and                            oxygen saturations were monitored continuously. The                             Endoscope was introduced through the mouth, and                            advanced to the second part of duodenum. The upper                            GI endoscopy was accomplished without difficulty.                            The patient tolerated the procedure well. Scope In: Scope Out: Findings:                 Vocal cord dysfunction. Pooling of secretions noted                            near pyriform and vocal cords didnot adduct with                            inspiration or expiration                           No gross lesions were noted in the entire esophagus.                           The gastroesophageal flap valve was visualized                            endoscopically and classified as Hill Grade II                            (fold present, opens with respiration).                           The Z-line was regular and was found 38 cm from the  incisors.                           A few less than 5 mm pedunculated and sessile                            polyps were found in the gastric fundus and in the                            gastric body. Biopsies were taken with a cold                            forceps for histology.                           The cardia and gastric fundus were normal on                            retroflexion.                           The examined duodenum was normal. Complications:            No immediate complications. Estimated Blood Loss:     Estimated blood loss was minimal. Impression:               - Vocal cord dysfunction (inappropriate vocal cord                            adduction) was found.                           - No gross lesions in esophagus.                           - Gastroesophageal flap valve classified as Hill                            Grade II (fold present, opens with respiration).                           - Z-line regular, 38 cm from the incisors.                            - A few gastric polyps. Biopsied.                           - Normal examined duodenum. Recommendation:           - Resume previous diet.                           - Continue present medications.                           - Await pathology results.                           -  Follow an antireflux regimen.                           - Esophageal manometry and 24 hr pH impedance study                            off PPI for 7 days and H2blocker for 48 hours,                            schedule next available appointment                           - Follow up visit with Dr Barron Alvine to discuss                            possible TIF for antireflux procedure Napoleon Form, MD 12/25/2019 9:40:12 AM This report has been signed electronically.

## 2019-12-25 NOTE — Progress Notes (Signed)
Called to room to assist during endoscopic procedure.  Patient ID and intended procedure confirmed with present staff. Received instructions for my participation in the procedure from the performing physician.  

## 2019-12-25 NOTE — Progress Notes (Signed)
Pt's states no medical or surgical changes since previsit or office visit.  VS WR 

## 2019-12-25 NOTE — Progress Notes (Signed)
Delayed D/C time, as pt. Waited in the RR for consultation with Dr. Lavon Paganini.

## 2019-12-29 ENCOUNTER — Telehealth: Payer: Self-pay | Admitting: *Deleted

## 2019-12-29 ENCOUNTER — Telehealth: Payer: Self-pay

## 2019-12-29 NOTE — Telephone Encounter (Signed)
Attempted f/u phone call. No answer. Left message. °

## 2019-12-29 NOTE — Telephone Encounter (Signed)
Called [phone] and left a message we tried to reach pt for a follow up call. maw

## 2019-12-31 ENCOUNTER — Other Ambulatory Visit: Payer: Self-pay

## 2019-12-31 DIAGNOSIS — K219 Gastro-esophageal reflux disease without esophagitis: Secondary | ICD-10-CM

## 2019-12-31 DIAGNOSIS — Z1159 Encounter for screening for other viral diseases: Secondary | ICD-10-CM

## 2020-01-05 ENCOUNTER — Encounter: Payer: Self-pay | Admitting: Gastroenterology

## 2020-01-05 ENCOUNTER — Ambulatory Visit
Admission: RE | Admit: 2020-01-05 | Discharge: 2020-01-05 | Disposition: A | Discharge status: Home / Self Care | Payer: No Typology Code available for payment source | Source: Ambulatory Visit | Attending: Internal Medicine | Admitting: Internal Medicine

## 2020-01-05 DIAGNOSIS — Z0000 Encounter for general adult medical examination without abnormal findings: Secondary | ICD-10-CM

## 2020-01-05 DIAGNOSIS — I10 Essential (primary) hypertension: Secondary | ICD-10-CM

## 2020-01-06 ENCOUNTER — Ambulatory Visit: Payer: 59 | Admitting: Family Medicine

## 2020-02-04 ENCOUNTER — Encounter: Payer: Self-pay | Admitting: Sports Medicine

## 2020-02-04 ENCOUNTER — Ambulatory Visit: Payer: 59 | Admitting: Sports Medicine

## 2020-02-04 ENCOUNTER — Other Ambulatory Visit: Payer: Self-pay

## 2020-02-04 VITALS — BP 124/86 | Ht 67.0 in | Wt 170.0 lb

## 2020-02-04 DIAGNOSIS — S76312D Strain of muscle, fascia and tendon of the posterior muscle group at thigh level, left thigh, subsequent encounter: Secondary | ICD-10-CM

## 2020-02-04 NOTE — Progress Notes (Signed)
PCP: Creola Corn, MD  Subjective:   HPI: Patient is a 49 y.o. male here for reevaluation of hamstring injury.  He was initially seen on 12/23/2019 and at that time ultrasound showed medial hamstring tendon partial tear.  It was recommended that he pursue conservative therapy with compression sleeve, ice/heat, relative rest from running.  Since that visit, patient did have continued significant pain with even day-to-day activity for a while, however over the last couple of weeks he has had improvement and now is able to run without pain up to about 3 miles, then he begins to feel some discomfort.  He has not tried any intervals or faster reps.  He has also played some tennis and occasionally has some pains with this.  He has not had any new reinjuries or major trauma to the area.  Past Medical History:  Diagnosis Date  . Achilles tendinitis on left   . ASTHMA   . Asthma   . GERD   . HALLUX RIGIDUS, ACQUIRED   . History of knee surgery 04/03/2017  . Hypertension   . Patellar tendinitis   . SACROILIAC JOINT DYSFUNCTION     Current Outpatient Medications on File Prior to Visit  Medication Sig Dispense Refill  . albuterol (VENTOLIN HFA) 108 (90 Base) MCG/ACT inhaler INHALE 2 PUFFS INTO THE LUNGS EVERY 6 HOURS AS NEEDED FOR WHEEZING OR SHORTNESS OF BREATH (Patient not taking: Reported on 12/25/2019) 6.7 g 0  . esomeprazole (NEXIUM) 40 MG capsule Take 1 capsule (40 mg total) by mouth daily at 12 noon. 90 capsule 3  . famotidine (PEPCID) 20 MG tablet Take 1 tablet (20 mg total) by mouth at bedtime. 30 tablet 3  . fluticasone (FLONASE) 50 MCG/ACT nasal spray Place 2 sprays into both nostrils daily. (Patient not taking: Reported on 12/25/2019) 16 g 6  . ketoconazole (NIZORAL) 2 % cream APP AA BID (Patient not taking: Reported on 12/25/2019)  2  . losartan (COZAAR) 50 MG tablet TAKE 1 TABLET(50 MG) BY MOUTH DAILY 30 tablet 5  . nitroGLYCERIN (NITRODUR - DOSED IN MG/24 HR) 0.2 mg/hr patch Use 1/4 patch  daily to the affected area. (Patient not taking: Reported on 12/25/2019) 30 patch 0  . sildenafil (VIAGRA) 100 MG tablet Take 0.5-1 tablets (50-100 mg total) by mouth daily as needed for erectile dysfunction. (Patient not taking: Reported on 12/25/2019) 5 tablet 11  . SYMBICORT 160-4.5 MCG/ACT inhaler INHALE 2 PUFFS INTO THE LUNGS TWICE DAILY (Patient not taking: Reported on 12/25/2019) 10.2 g 0   No current facility-administered medications on file prior to visit.    Past Surgical History:  Procedure Laterality Date  . bone spurs Bilateral 03/2017   bone spurs removed  . KNEE SURGERY    . NO PAST SURGERIES      No Known Allergies  Social History   Socioeconomic History  . Marital status: Married    Spouse name: Not on file  . Number of children: Not on file  . Years of education: Not on file  . Highest education level: Not on file  Occupational History  . Not on file  Tobacco Use  . Smoking status: Never Smoker  . Smokeless tobacco: Never Used  Vaping Use  . Vaping Use: Never used  Substance and Sexual Activity  . Alcohol use: Yes    Alcohol/week: 4.0 standard drinks    Types: 4 Standard drinks or equivalent per week    Comment: occasional  . Drug use: No  . Sexual  activity: Not on file  Other Topics Concern  . Not on file  Social History Narrative  . Not on file   Social Determinants of Health   Financial Resource Strain:   . Difficulty of Paying Living Expenses: Not on file  Food Insecurity:   . Worried About Programme researcher, broadcasting/film/video in the Last Year: Not on file  . Ran Out of Food in the Last Year: Not on file  Transportation Needs:   . Lack of Transportation (Medical): Not on file  . Lack of Transportation (Non-Medical): Not on file  Physical Activity:   . Days of Exercise per Week: Not on file  . Minutes of Exercise per Session: Not on file  Stress:   . Feeling of Stress : Not on file  Social Connections:   . Frequency of Communication with Friends and  Family: Not on file  . Frequency of Social Gatherings with Friends and Family: Not on file  . Attends Religious Services: Not on file  . Active Member of Clubs or Organizations: Not on file  . Attends Banker Meetings: Not on file  . Marital Status: Not on file  Intimate Partner Violence:   . Fear of Current or Ex-Partner: Not on file  . Emotionally Abused: Not on file  . Physically Abused: Not on file  . Sexually Abused: Not on file    Family History  Problem Relation Age of Onset  . Hypertension Father   . Heart disease Paternal Grandfather   . Hypertension Paternal Grandfather   . Alcohol abuse Other        parent & grandparent  . Hypertension Other   . Colon cancer Neg Hx   . Esophageal cancer Neg Hx   . Stomach cancer Neg Hx   . Rectal cancer Neg Hx   . Colon polyps Neg Hx     BP 124/86   Ht 5\' 7"  (1.702 m)   Wt 170 lb (77.1 kg)   BMI 26.63 kg/m   Review of Systems: See HPI above.     Objective:  Physical Exam:  Gen: NAD, comfortable in exam room  Left leg: -Inspection: No deformities, swelling, or bruising -Palpation: Mildly tender to palpation in the muscle belly of the medial hamstrings, some tenderness also in the area of the adductors -ROM: Intact with flexion and extension at the hip and knee without pain -Strength: 5/5 strength with resisted knee flexion at 90 and 30 degrees, and also with internal rotation and external rotation without pain.  5/5 strength with abduction of the leg without pain. Neurovascularly intact distally  Limited ultrasound of the left posterior leg: There is an approximately 3 cm area of hypoechogenicity in the superficial aspect of the medial hamstrings muscle belly over the area of pain.   Some mild increased vascularity in this area as well. No tear noted  Impression: Partial hamstring strain without signs of scar tissue formation  Ultrasound and interpretation by Dr. and  Alfredo Bach. Fields,  MD   Assessment & Plan:  1.  Left medial hamstring strain Patient with improvement in his symptoms since last visit.  He is now progressing his activities as tolerated.  Discussed that it is normal for healing to take 8 to 12 weeks.  We will plan to continue hamstring compression sleeve, and will also add divers and hamstring slide to his home PT regimen.  Plan to follow-up in 6 weeks if he does not have near complete improvement by then.

## 2020-02-09 ENCOUNTER — Ambulatory Visit: Payer: 59 | Admitting: Sports Medicine

## 2020-03-08 ENCOUNTER — Encounter: Payer: Self-pay | Admitting: Gastroenterology

## 2020-03-08 ENCOUNTER — Ambulatory Visit: Payer: 59 | Admitting: Gastroenterology

## 2020-03-08 VITALS — BP 124/72 | HR 56 | Ht 68.0 in | Wt 178.1 lb

## 2020-03-08 DIAGNOSIS — K219 Gastro-esophageal reflux disease without esophagitis: Secondary | ICD-10-CM | POA: Diagnosis not present

## 2020-03-08 DIAGNOSIS — R05 Cough: Secondary | ICD-10-CM

## 2020-03-08 DIAGNOSIS — R053 Chronic cough: Secondary | ICD-10-CM

## 2020-03-08 NOTE — Progress Notes (Signed)
Chief Complaint: GERD, chronic cough  Referring Provider:    Marsa Aris, MD   HPI:     Cameron White is a 49 y.o. male referred to me by Dr. Lavon Paganini for evaluation of possible antireflux intervention with Transoral Incisionless Fundoplication (TIF) with a goal to stop or significantly reduce acid suppression therapy.  Longstanding history of reflux, and has been taking PPI for the last 10+ years.  Symptoms have been worsening over the last 5-6 months, including worsening chronic cough.  Chronic cough is the most bothersome symptom, with mild heartburn.  Has some episodes that are severe enough to make him dizzy.  Cut caffeine out of his diet and rarely drinks alcohol.  No dysphagia.  Today, states sxs currently well controlled with Nexium, so stopped Pepcid.  Otherwise in his usual state of health and without any complaints today.  GERD history: -Index symptoms: Chronic cough/LPR, heartburn -Exacerbating features: Caffeine, chocolate, EtOH -Medications trialed: Pantoprazole, Nexium, Pepcid -Current medications: Nexium 40 mg/day,  -Complications: LPR  GERD evaluation: -Last EGD: 12/2019 -Barium esophagram: N/A -Esophageal Manometry: Scheduled for 03/23/2020 -pH/Impedance: Scheduled for 03/23/2020 -Bravo: N/A -Transnasal Flexible Laryngoscopy with Videostroboscopy July 14, 2018 by Dr. Delford Field at [facility]: Mild diffuse erythema, modest posterior laryngeal edema.  Findings consistent with LPR  Endoscopic History: -EGD (12/25/2019, Dr. Lavon Paganini): Regular Z-line, Hill grade 2, fundic gland polyps, otherwise normal  GERD-HRQL Questionnaire Score: 4/50 (on PPI)     Past Medical History:  Diagnosis Date  . Achilles tendinitis on left   . ASTHMA   . Asthma   . GERD   . HALLUX RIGIDUS, ACQUIRED   . History of knee surgery 04/03/2017  . Hypertension   . Patellar tendinitis   . SACROILIAC JOINT DYSFUNCTION      Past Surgical  History:  Procedure Laterality Date  . bone spurs Bilateral 03/2017   bone spurs removed  . KNEE SURGERY    . NO PAST SURGERIES     Family History  Problem Relation Age of Onset  . Hypertension Father   . Heart disease Paternal Grandfather   . Hypertension Paternal Grandfather   . Alcohol abuse Other        parent & grandparent  . Hypertension Other   . Colon cancer Neg Hx   . Esophageal cancer Neg Hx   . Stomach cancer Neg Hx   . Rectal cancer Neg Hx   . Colon polyps Neg Hx    Social History   Tobacco Use  . Smoking status: Never Smoker  . Smokeless tobacco: Never Used  Vaping Use  . Vaping Use: Never used  Substance Use Topics  . Alcohol use: Yes    Alcohol/week: 4.0 standard drinks    Types: 4 Standard drinks or equivalent per week    Comment: occasional  . Drug use: No   Current Outpatient Medications  Medication Sig Dispense Refill  . esomeprazole (NEXIUM) 40 MG capsule Take 1 capsule (40 mg total) by mouth daily at 12 noon. 90 capsule 3  . fluticasone (FLONASE) 50 MCG/ACT nasal spray Place 2 sprays into both nostrils daily. (Patient taking differently: Place 2 sprays into both nostrils as needed. ) 16 g 6  . ketoconazole (NIZORAL) 2 % cream APP AA BID  2  . losartan (COZAAR) 50 MG tablet TAKE 1 TABLET(50 MG) BY MOUTH DAILY 30 tablet 5  . nitroGLYCERIN (NITRODUR - DOSED IN MG/24 HR)  0.2 mg/hr patch Use 1/4 patch daily to the affected area. 30 patch 0  . albuterol (VENTOLIN HFA) 108 (90 Base) MCG/ACT inhaler INHALE 2 PUFFS INTO THE LUNGS EVERY 6 HOURS AS NEEDED FOR WHEEZING OR SHORTNESS OF BREATH (Patient not taking: Reported on 12/25/2019) 6.7 g 0  . famotidine (PEPCID) 20 MG tablet Take 1 tablet (20 mg total) by mouth at bedtime. (Patient not taking: Reported on 03/08/2020) 30 tablet 3  . sildenafil (VIAGRA) 100 MG tablet Take 0.5-1 tablets (50-100 mg total) by mouth daily as needed for erectile dysfunction. (Patient not taking: Reported on 12/25/2019) 5 tablet 11  .  SYMBICORT 160-4.5 MCG/ACT inhaler INHALE 2 PUFFS INTO THE LUNGS TWICE DAILY (Patient not taking: Reported on 12/25/2019) 10.2 g 0   No current facility-administered medications for this visit.   No Known Allergies   Review of Systems: All systems reviewed and negative except where noted in HPI.     Physical Exam:    Wt Readings from Last 3 Encounters:  03/08/20 178 lb 2 oz (80.8 kg)  02/04/20 170 lb (77.1 kg)  12/25/19 174 lb (78.9 kg)    BP 124/72   Pulse (!) 56   Ht 5\' 8"  (1.727 m)   Wt 178 lb 2 oz (80.8 kg)   BMI 27.08 kg/m  Constitutional:  Pleasant, in no acute distress. Psychiatric: Normal mood and affect. Behavior is normal. Neurological: Alert and oriented to person place and time. Skin: Skin is warm and dry. No rashes noted.   ASSESSMENT AND PLAN;   1) GERD 2) Chronic cough 3) Laryngopharyngeal reflux (LPR)  49 year old male with longstanding history of reflux, primarily manifesting as his chronic cough/LPR symptoms.  Symptoms currently well controlled with Nexium 40 mg/day, but does report having episodic flares of chronic cough which happens a few times/year.  EGD otherwise largely unrevealing.  -Had a long discussion with the patient today regarding the pathophysiology of reflux to include ongoing medical management vs antireflux surgical options, namely TIF.  While symptoms are currently well controlled, he does describe several flares/year of bothersome chronic cough episodes.  Additionally, previous laryngoscopy notable for reflux changes. -Will follow up on results of pending Esophageal Manometry and pH/impedance (off PPI) -Depending on results of pH/impedance study, if c/w significant esophageal acid exposure/refluxate, can move forward with TIF if patient wishes to proceed.  Otherwise, will continue with current medical management -Discussed the risks and benefits of TIF at length today, to include postoperative dietary and exercise/activity  limitations -To call GI clinic after completion of EM and pH/impedance to review results  I spent 30 minutes of time, including in depth chart review, independent review of results as outlined above, communicating results with the patient directly, face-to-face time with the patient, coordinating care, and ordering studies and medications as appropriate, and documentation.    44, DO, FACG  03/08/2020, 1:22 PM   03/10/2020, MD

## 2020-03-08 NOTE — Patient Instructions (Addendum)
If you are age 51 or older, your body mass index should be between 23-30. Your Body mass index is 27.08 kg/m. If this is out of the aforementioned range listed, please consider follow up with your Primary Care Provider.  If you are age 66 or younger, your body mass index should be between 19-25. Your Body mass index is 27.08 kg/m. If this is out of the aformentioned range listed, please consider follow up with your Primary Care Provider.   Follow up as needed.  It was a pleasure to see you today!  Vito Cirigliano, D.O.

## 2020-03-17 ENCOUNTER — Ambulatory Visit: Payer: 59 | Admitting: Sports Medicine

## 2020-03-17 ENCOUNTER — Other Ambulatory Visit: Payer: Self-pay

## 2020-03-17 VITALS — BP 124/84 | Ht 68.0 in | Wt 175.0 lb

## 2020-03-17 DIAGNOSIS — M76899 Other specified enthesopathies of unspecified lower limb, excluding foot: Secondary | ICD-10-CM | POA: Diagnosis not present

## 2020-03-17 NOTE — Progress Notes (Addendum)
PCP: Creola Corn, MD  Subjective:   HPI: Patient is a 49 y.o. 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 Wyline Mood several years ago here for evaluation of bilateral anterior knee pain.  He reports that both of his quadriceps tendons just above the patella are painful again.  He is having pain especially with running, tennis and cycling.  He believes that running is what aggravates it the most.  His left knee is slightly more painful than his right.  He has noticed that his quads feel tight and he is not able to fully flex his knee without pain.  He has tried ice, ibuprofen with limited success.  No trauma, no injury, no fall. NTG seems to help the pain some as does ice  Review of Systems:  Per HPI. No giving way or locking of the knee  PMFSH, medications and smoking status reviewed.      Objective:  Physical Exam:  [facility] 02/04/2020 03/17/2020  Frequency of aerobic exercise (# of days/week) 5 5  Average time in minutes 30 30  Frequency of strengthening activities (# of days/week) 2 2    BP 124/84   Ht 5\' 8"  (1.727 m)   Wt 175 lb (79.4 kg)   BMI 26.61 kg/m   Gen: awake, alert, NAD, comfortable in exam room Pulm: breathing unlabored  Bilateral knee  Inspection: Bilateral knees without evidence of erythema, ecchymosis, swelling, edema. No effusion present  Active ROM: Somewhat limited, 0 to 150 degrees bilaterally with pain at end flexion.  Strength: 5/5 strength to resisted flexion/extension without pain  Patella: Negative patellar grind. No patellar facet tenderness. No apprehension.  He does have mild tenderness palpation at the proximal patella and with palpation of the quad tendon bilaterally.  Tibia: No tibial plateau, tibial tuberosity tenderness.  Joint line: No joint line tenderness.  Popliteal: No popliteal tenderness to the insertional gastroc. No  insertional biceps femoris, semimembranosis, semitendinosis tenderness.  Lachmans: Stable bilaterally with firm endpoint  Anterior/Posterior drawer: Stable bilaterally Varus/valgus stress at 0, 15d: Negative for pain, laxity  Limited ultrasound of bilateral knees:  Right knee: -Quadricep tendon: Visualized and shows a large spurring off of the patella with surrounding edema.  There also multiple small calcifications mostly in the lateral quadriceps tendon.  No significant tear. -No significant knee effusion -Patellar tendon visualized and without any significant abnormalities.  Left knee: -Quadricep tendon: Visualized and shows several large calcifications and small spurring of the patella with edema surrounding these areas.  No significant tear. -No significant knee effusion. -Patellar tendon visualized and without evident abnormalities.  Impression: -Bilateral quadriceps calcific tendinopathy  Ultrasound and interpretation by Dr. and Alfredo Bach. Fields, MD      Assessment & Plan:  1.  Bilateral quadriceps calcific tendinopathy  Exam and ultrasound findings consistent with fairly severe calcific tendinopathy which explains his symptoms.  We will plan to start with aggressive massage followed by ice, topical Arnica, transition to lower weightbearing activity such as elliptical, wearing cushioned shoes as much as possible, and follow-up for extracorporeal shockwave therapy.  Sibyl Parr, MD [facility] 03/17/2020 4:19 PM   I observed and examined the patient with the [facility] resident and agree with assessment and plan.  Note reviewed and modified by me. [facility], MD  Since his original visit we have enrolled this patient in extracorporal shockwave treatments to his left quadriceps as  a trial to see if they will be beneficial. Treatment 1 power level 60/impulses increased from 5-10 frequency/total impulses 1500 -July 21, 2020  Treatment to -power level  90/impulses 10 frequency/impulses total 2000 -August 02, 2020  Treatment 3 -power level 120/impulses 15 frequency/impulses total 2500 -August 04, 2020  To date: some mild pain after treatment.  Some improved flexibility of the quadriceps.

## 2020-03-17 NOTE — Patient Instructions (Signed)
Thank you for coming in to see [place] today!  You have chronic tendinopathy with calcifications in your quadriceps tendons.  Please see below to review our plan for today's visit:   1.   Start doing aggressive massage with the spiky ball and icing afterwards. 2.   Topical Arnica gel 2 times daily 3.   Hold off on running and tennis for now after your upcoming match, and start exercising on the elliptical to see if this reproduces pain. 4.  When you are doing weightbearing activities, use the most cushioned shoe that you have. 5.  Follow-up for extracorporeal shockwave therapy once we have that up and running in the clinic.  Please call the clinic at [phone] if your symptoms worsen or you have any concerns. It was our pleasure to serve you.       Dr. Guy Sandifer Dr. Roanna Epley [facility]

## 2020-03-19 ENCOUNTER — Other Ambulatory Visit (HOSPITAL_COMMUNITY): Admission: RE | Admit: 2020-03-19 | Payer: 59 | Source: Ambulatory Visit

## 2020-03-22 ENCOUNTER — Other Ambulatory Visit (HOSPITAL_COMMUNITY)
Admission: RE | Admit: 2020-03-22 | Discharge: 2020-03-22 | Disposition: A | Discharge status: Home / Self Care | Payer: 59 | Source: Ambulatory Visit | Attending: Gastroenterology | Admitting: Gastroenterology

## 2020-03-22 DIAGNOSIS — Z01812 Encounter for preprocedural laboratory examination: Secondary | ICD-10-CM | POA: Diagnosis not present

## 2020-03-22 DIAGNOSIS — Z20822 Contact with and (suspected) exposure to covid-19: Secondary | ICD-10-CM | POA: Insufficient documentation

## 2020-03-22 LAB — SARS CORONAVIRUS 2 (TAT 6-24 HRS): SARS Coronavirus 2: NEGATIVE

## 2020-03-23 ENCOUNTER — Encounter (HOSPITAL_COMMUNITY): Payer: Self-pay | Admitting: Gastroenterology

## 2020-03-23 ENCOUNTER — Ambulatory Visit (HOSPITAL_COMMUNITY)
Admission: RE | Admit: 2020-03-23 | Discharge: 2020-03-23 | Disposition: A | Discharge status: Home / Self Care | Payer: 59 | Attending: Gastroenterology | Admitting: Gastroenterology

## 2020-03-23 ENCOUNTER — Encounter (HOSPITAL_COMMUNITY)
Admission: RE | Disposition: A | Discharge status: Home / Self Care | Payer: Self-pay | Source: Home / Self Care | Attending: Gastroenterology

## 2020-03-23 DIAGNOSIS — R12 Heartburn: Secondary | ICD-10-CM | POA: Diagnosis not present

## 2020-03-23 DIAGNOSIS — K219 Gastro-esophageal reflux disease without esophagitis: Secondary | ICD-10-CM | POA: Diagnosis not present

## 2020-03-23 DIAGNOSIS — R059 Cough, unspecified: Secondary | ICD-10-CM

## 2020-03-23 HISTORY — PX: 24 HOUR PH STUDY: SHX5419

## 2020-03-23 HISTORY — PX: ESOPHAGEAL MANOMETRY: SHX5429

## 2020-03-23 SURGERY — MANOMETRY, ESOPHAGUS
Anesthesia: Choice

## 2020-03-23 MED ORDER — LIDOCAINE VISCOUS HCL 2 % MT SOLN
OROMUCOSAL | Status: AC
  Filled 2020-03-23: qty 15

## 2020-03-23 SURGICAL SUPPLY — 2 items
FACESHIELD LNG OPTICON STERILE (SAFETY) IMPLANT
GLOVE BIO SURGEON STRL SZ8 (GLOVE) ×6 IMPLANT

## 2020-03-23 NOTE — Progress Notes (Signed)
Esophageal manometry performed per protocol.  Patient tolerated well.  PH probe then placed at 36 cm at right nare.  Patient verbalized understanding of use of equipment.  Also verbalized understanding of when to return to [facility] to have probe removed.  Both reports to be sent to Dr. Marsa Aris.

## 2020-03-28 ENCOUNTER — Encounter (HOSPITAL_COMMUNITY): Payer: Self-pay | Admitting: Gastroenterology

## 2020-04-23 ENCOUNTER — Ambulatory Visit: Payer: 59 | Attending: Internal Medicine

## 2020-04-23 DIAGNOSIS — Z23 Encounter for immunization: Secondary | ICD-10-CM

## 2020-04-23 NOTE — Progress Notes (Signed)
   Covid-19 Vaccination Clinic  Name:  Cameron White    MRN: [id number] DOB: 1970/11/22  04/23/2020  Cameron White was observed post Covid-19 immunization for 15 minutes without incident. He was provided with Vaccine Information Sheet and instruction to access the V-Safe system.   Mr. Burnham was instructed to call 911 with any severe reactions post vaccine: Marland Kitchen Difficulty breathing  . Swelling of face and throat  . A fast heartbeat  . A bad rash all over body  . Dizziness and weakness   Immunizations Administered    Name Date Dose VIS Date Route   Pfizer COVID-19 Vaccine 04/23/2020 10:53 AM 0.3 mL 03/30/2020 Intramuscular   Manufacturer: [employer], [employer]   Lot: [id number]   NDC: [address]-5

## 2020-04-25 ENCOUNTER — Telehealth: Payer: Self-pay | Admitting: Gastroenterology

## 2020-04-25 NOTE — Telephone Encounter (Signed)
Called back Mr. Cameron White, discussed results of pH study in detail.  He has intermittent episodes of coughing.  He was doing well for a week after he stopped the medication and did not have any coughing episodes during the test.  It is possible he has intermittent GERD or possible seasonal allergies triggering cough, decrease intrathoracic pressure and thereby exaggerating reflux episodes.  Advised him to use either Nexium or Pepcid as needed.  Use antihistamines like Claritin for possible seasonal allergies as needed We will hold off antireflux procedure/surgery for now, will reevaluate if has worsening symptoms.  I will forward to Dr. Barron Alvine so he is aware as well Follow-up as needed

## 2020-04-26 NOTE — Telephone Encounter (Signed)
Thank you for the update on the pH study results. Agree with that plan and I will otherwise remain on standby. Thanks.

## 2020-04-29 DIAGNOSIS — R12 Heartburn: Secondary | ICD-10-CM

## 2020-09-22 ENCOUNTER — Other Ambulatory Visit: Payer: Self-pay | Admitting: Internal Medicine

## 2020-09-22 NOTE — Telephone Encounter (Signed)
Refilled Losartan 50mg  for 90 days, 0 refills.  Pt needs OV for additional refills.

## 2020-10-27 ENCOUNTER — Other Ambulatory Visit: Payer: Self-pay

## 2020-10-27 ENCOUNTER — Ambulatory Visit: Payer: 59 | Admitting: Sports Medicine

## 2020-10-27 DIAGNOSIS — M25552 Pain in left hip: Secondary | ICD-10-CM

## 2020-10-27 DIAGNOSIS — M79662 Pain in left lower leg: Secondary | ICD-10-CM | POA: Diagnosis not present

## 2020-10-27 NOTE — Progress Notes (Signed)
Chief complaint left calf and left thigh pain  Patient was noted about 6 weeks ago to have a left lateral calf strain documented on ultrasound with a small tear That has gradually improved although has been hard for him to run Since that time is okay knee pain which is sharp and intermittent And runs down the distal half of the left inner thigh He could not associate that with any specific injury It sometimes flares when he is sitting on the toilet with hip flexion Doing bench presses can also cause this to flare Going up steps causes the pain to radiate  The calf itself is much less tender but does not feel 100% normal  Review of systems No bruising around the calf No numbness or tingling into the foot  Physical exam Pleasant white male in no acute distress BP 132/88   Ht 5\' 7"  (1.702 m)   Wt 170 lb (77.1 kg)   BMI 26.63 kg/m  [facility] 02/04/2020 03/17/2020  Frequency of aerobic exercise (# of days/week) 5 5  Average time in minutes 30 30  Frequency of strengthening activities (# of days/week) 2 2   Examination of the left reveals some increased tenderness to palpation just in between the abductors and the vastus medialis Full hip range of motion No groin pain with motion Excellent hip abduction and abduction strength Excellent hip flexion strength Strength testing on abduction causes a little increase in the pain but other motions do not  Calf reveals no swelling and minimal tenderness  Ultrasound of left and left calf The abductor muscles and the vastus medialis look normal in the area where the patient experiences pain In the area he experiences pain I am able to retract the saphenous nerve and with sono palpation he has pain when I put pressure directly over the nerve  The left lateral calf still shows a small tear On long axis view there is distal muscular disruption with some hypoechoic change Both of these appear less than form we scanned  him at the ultrasound lab  Impression  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 Royal Hawthorn, MD

## 2020-10-27 NOTE — Assessment & Plan Note (Signed)
Patient has a recurrence of calf pain This time it is associated with some tearing of the lateral gastrocnemius Advised him to use compression Icing May try nitroglycerin Expect this to take another 6 weeks to heal

## 2020-10-27 NOTE — Assessment & Plan Note (Signed)
Patient no longer has hip pain but more recently has developed some sharp intermittent abductor sided thigh pain in the distal half of the thigh  This appears to be a neuropraxia probably from stretch of the saphenous nerve On ultrasound the pain pattern tracks all along the saphenous nerve We will treated conservatively with thigh compression heat 100 mg Vit b6

## 2020-10-27 NOTE — Patient Instructions (Signed)
Left calf tear About 50% healed 6 more weeks Restart NTG Calf compression for exercise Ice is good  Nerve injury - saphenous? Vit B6 100 mgm day Thigh sleeve apply heat each evening Biking is good Prop legs for bench press Avoid prolonged hip flexion

## 2021-01-24 ENCOUNTER — Other Ambulatory Visit: Payer: Self-pay

## 2021-01-24 ENCOUNTER — Ambulatory Visit: Payer: 59 | Admitting: Sports Medicine

## 2021-01-24 DIAGNOSIS — M7662 Achilles tendinitis, left leg: Secondary | ICD-10-CM

## 2021-01-24 NOTE — Progress Notes (Addendum)
PCP: Creola Corn, MD  Subjective:   HPI: Patient is a 50 y.o. male here for follow up left calf pain. Noted to have left lateral gastrocnemius tear last visit 3 months ago. The pain is located in the mid portion of his achilles, has been present for the past month, non-radiating, sore in quality, with mild swelling, worse with activity (mostly running), and improved with rest. He has been trying running for 1 min, then taking a 1 minute break which initially felt fine but now bothers him. There was no specific injury. There's no change in his symptoms between running on hard vs. Soft surfaces. He's tried a topical NTG patch which has helped minimally. He cannot do a single leg calf raise due to the pain.     Past Medical History:  Diagnosis Date   Achilles tendinitis on left    ASTHMA    Asthma    GERD    HALLUX RIGIDUS, ACQUIRED    History of knee surgery 04/03/2017   Hypertension    Patellar tendinitis    SACROILIAC JOINT DYSFUNCTION     Current Outpatient Medications on File Prior to Visit  Medication Sig Dispense Refill   albuterol (VENTOLIN HFA) 108 (90 Base) MCG/ACT inhaler INHALE 2 PUFFS INTO THE LUNGS EVERY 6 HOURS AS NEEDED FOR WHEEZING OR SHORTNESS OF BREATH (Patient not taking: Reported on 12/25/2019) 6.7 g 0   esomeprazole (NEXIUM) 40 MG capsule Take 1 capsule (40 mg total) by mouth daily at 12 noon. 90 capsule 3   famotidine (PEPCID) 20 MG tablet Take 1 tablet (20 mg total) by mouth at bedtime. (Patient not taking: Reported on 03/08/2020) 30 tablet 3   fluticasone (FLONASE) 50 MCG/ACT nasal spray Place 2 sprays into both nostrils daily. (Patient taking differently: Place 2 sprays into both nostrils as needed. ) 16 g 6   ketoconazole (NIZORAL) 2 % cream APP AA BID  2   losartan (COZAAR) 50 MG tablet TAKE 1 TABLET(50 MG) BY MOUTH DAILY 90 tablet 0   nitroGLYCERIN (NITRODUR - DOSED IN MG/24 HR) 0.2 mg/hr patch Use 1/4 patch daily to the affected area. 30 patch 0   sildenafil  (VIAGRA) 100 MG tablet Take 0.5-1 tablets (50-100 mg total) by mouth daily as needed for erectile dysfunction. (Patient not taking: Reported on 12/25/2019) 5 tablet 11   SYMBICORT 160-4.5 MCG/ACT inhaler INHALE 2 PUFFS INTO THE LUNGS TWICE DAILY (Patient not taking: Reported on 12/25/2019) 10.2 g 0   No current facility-administered medications on file prior to visit.    Past Surgical History:  Procedure Laterality Date   23 HOUR PH STUDY N/A 03/23/2020   Procedure: 24 HOUR PH STUDY;  Surgeon: Napoleon Form, MD;  Location: [facility];  Service: Endoscopy;  Laterality: N/A;  24 hour Ph Impedance study off PPI and H2 blocker   bone spurs Bilateral 03/2017   bone spurs removed   ESOPHAGEAL MANOMETRY N/A 03/23/2020   Procedure: ESOPHAGEAL MANOMETRY (EM);  Surgeon: Napoleon Form, MD;  Location: [facility];  Service: Endoscopy;  Laterality: N/A;   KNEE SURGERY     NO PAST SURGERIES      No Known Allergies  Social History   Socioeconomic History   Marital status: Married    Spouse name: Not on file   Number of children: Not on file   Years of education: Not on file   Highest education level: Not on file  Occupational History   Not on file  Tobacco Use  Smoking status: Never   Smokeless tobacco: Never  Vaping Use   Vaping Use: Never used  Substance and Sexual Activity   Alcohol use: Yes    Alcohol/week: 4.0 standard drinks    Types: 4 Standard drinks or equivalent per week    Comment: occasional   Drug use: No   Sexual activity: Not on file  Other Topics Concern   Not on file  Social History Narrative   Not on file   Social Determinants of Health   Financial Resource Strain: Not on file  Food Insecurity: Not on file  Transportation Needs: Not on file  Physical Activity: Not on file  Stress: Not on file  Social Connections: Not on file  Intimate Partner Violence: Not on file    Family History  Problem Relation Age of Onset   Hypertension Father     Heart disease Paternal Grandfather    Hypertension Paternal Grandfather    Alcohol abuse Other        parent & grandparent   Hypertension Other    Colon cancer Neg Hx    Esophageal cancer Neg Hx    Stomach cancer Neg Hx    Rectal cancer Neg Hx    Colon polyps Neg Hx     BP 130/84   Ht 5\' 7"  (1.702 m)   Wt 170 lb (77.1 kg)   BMI 26.63 kg/m   [facility] 02/04/2020 03/17/2020  Frequency of aerobic exercise (# of days/week) 5 5  Average time in minutes 30 30  Frequency of strengthening activities (# of days/week) 2 2    No flowsheet data found.  Review of Systems: See HPI above.     Objective:  Physical Exam:  Gen: NAD, comfortable in exam room Ankle/Foot, left: TTP noted at the mid achilles with visible swelling. No visible erythema, ecchymosis, or bony deformity. No notable pes planus/cavus deformity. Transverse arch grossly intact; Normal eversion/inversion stress testing. Range of motion is full in all directions. Strength is 5/5 in all directions. No tenderness at the insertion/body/myotendinous junction of the Achilles tendon; No peroneal tendon tenderness or subluxation; No tenderness on posterior aspects of lateral and medial malleolus; Stable lateral and medial ligaments; Talar dome nontender; No plantar calcaneal tenderness; No tenderness over the navicular prominence or base of the 5th MT; No tenderness over cuboid; No tenderness at the distal metatarsals; Able to walk 4 steps.  Provocative Testing:   - Anterior Drawer: NEG  - Talar Tilt: NEG  - Kleiger's Test: NEG  - Tib/Fib Squeeze Test: NEG; Calcaneal Squeeze Test: NEG  - Tinel's test at Tarsal Tunnel: NEG  05/17/2020 test:  NEG    Assessment & Plan:   1. Achilles tendonitis Secondary to overuse injury. Based on the timeframe of injury he has borderline tendonopathy. His U/S today showed thickening, edema, and no increased blood flow to the injured area. Based on best practices, I  recommend protection for the next 4 days with brace, optimal loading (no exercise and partial to full WB), ice/compression/elevation. We are providing him with a heal lift to take some tension off his achilles tendon during walking. He is instructed to begin doing tip-toe raises, and once he can do the exercises on the stairs without pain, it is safe to gradually return to running. He can take aleve for 2-3 days to see if it helps his symptoms. If there is no improvement in 2-3 weeks, he is to return to clinic for further evaluation  and possible shock wave therapy.   Dolan Amen, MD, visiting resident  Patient seen and evaluated with the resident.  I agree with the above plan of care.  Bedside ultrasound today shows thickening and edema of the mid substance of the Achilles tendon.  Some slight hypoechoic changes within the substance consistent with tendinopathy as well.  Gloris Manchester finds that if he wears heel lifts with his custom orthotics it tends to push his heel out of the back of his tennis shoe.  Therefore, we will treat him with a 5/16 inch lift on a simple green sports insole until his symptoms improve.  I recommended that he avoid running until he is able to resume his [place] exercises without significant pain.  Continue with icing and over-the-counter anti-inflammatories.  Follow-up for ongoing or recalcitrant issues.

## 2021-01-24 NOTE — Patient Instructions (Addendum)
Thanks for coming in today Cameron White. Below is your treatment plan we discussed:   We are providing you a heel lift to take some tension off your achilles tendon Take the rest of the week off in terms of exercise  Begin doing tip toe raises, and once you can do the exercises on the stairs without pain, it is safe to return to running Continue to ice every day the best you can You can take aleve for 2-3 days to see if it helps your symptoms If there is no improvement in 2-3 weeks, come back and see [place] and we can try shock wave therapy

## 2021-06-08 ENCOUNTER — Other Ambulatory Visit: Payer: Self-pay | Admitting: Internal Medicine

## 2021-11-28 ENCOUNTER — Ambulatory Visit: Payer: Self-pay

## 2021-11-28 ENCOUNTER — Ambulatory Visit: Payer: 59 | Admitting: Sports Medicine

## 2021-11-28 VITALS — BP 138/82 | Ht 67.0 in | Wt 170.0 lb

## 2021-11-28 DIAGNOSIS — M25562 Pain in left knee: Secondary | ICD-10-CM

## 2021-11-28 MED ORDER — NITROGLYCERIN 0.2 MG/HR TD PT24: MEDICATED_PATCH | TRANSDERMAL | 0 refills | Status: DC

## 2021-11-28 NOTE — Progress Notes (Signed)
    SUBJECTIVE:   CHIEF COMPLAINT / HPI:   Left knee pain Patient presenting to the clinic today for evaluation of left knee pain.  Reports that approximately 2 months ago he was playing tennis and jumped to the left knee and noticed sudden pain.  Over the past 2 months he has been stretching, taking OTC pain medications.  Approximately 2 weeks ago he started resting it more and has noticed some improvement.  When he goes to the gym and notices while doing leg presses he feels the pain in the front of his knee.  He did notice that yesterday he was doing leg presses and was not noticing any pain.  Denies any swelling.  Denies any blunt trauma to the knee other than stepping wrong while playing tennis.  Has not had issues with this knee in the past.  OBJECTIVE:   BP 138/82 (BP Location: Left Arm, Patient Position: Sitting, Cuff Size: Normal)   Ht 5\' 7"  (1.702 m)   Wt 170 lb (77.1 kg)   BMI 26.63 kg/m   General: Pleasant 51 year old male Left knee: No gross deformity, ecchymoses, swelling.  Mild crepitus to the left knee Tenderness to palpation on insertion of patella tendon on patella FROM. Negative ant/post drawers. Negative valgus/varus testing. Negative lachmans. Negative mcmurrays, apleys, patellar apprehension. NV intact distally.  Ultrasound of Left Knee No SPP effusion Calcific changes and superior patellar spurs noted at insertion of quadriceps tendon Patellar tendon shows calcifications Distally the tibial tubercle is fragmented The tendon width is 0.66 but not irregular Soft tissue along medial patella shows increased doppler flow and some disruption The medial patellar border is irregular Medial and lateral meniscus appear normal  Impression : calcific tendinopathy of patellar and quadriceps tendons  Ultrasound and interpretation by 40. Fields, MD   ASSESSMENT/PLAN:   Left knee pain Patient with left knee pain for approximately 2 months.  Has been improving  over the past 2 weeks but still present.  Physical exam reassuring other tenderness to palpation along base of the patella radiating medially.  Full range of motion.  Ultrasound showing considerable calcification in the patella tendon but no acute tears.  Feel that the pain is likely related to IV use in the calcification in the patient's patellar tendons.  We will treat with nitro patches daily and follow-up in 2 to 4 weeks if needed.     Sibyl Parr, MD [facility]   I observed and examined the patient with the resident and agree with assessment and plan.  Note reviewed and modified by me. [facility], MD

## 2021-11-28 NOTE — Patient Instructions (Signed)

## 2021-11-28 NOTE — Assessment & Plan Note (Addendum)
Patient with left knee pain for approximately 2 months.  Has been improving over the past 2 weeks but still present.  Physical exam reassuring other tenderness to palpation along base of the patella radiating medially.  Full range of motion.  Ultrasound showing considerable calcification in the patella tendon but no acute tears.  Feel that the pain is likely related to capsular strain and irritation from the calcification in the patient's patellar tendons.  We will treat with nitro patches daily and follow-up in  4 weeks if needed.

## 2022-03-30 ENCOUNTER — Other Ambulatory Visit: Payer: Self-pay | Admitting: Sports Medicine

## 2022-04-04 ENCOUNTER — Other Ambulatory Visit: Payer: Self-pay

## 2022-04-04 MED ORDER — NITROGLYCERIN 0.2 MG/HR TD PT24: MEDICATED_PATCH | TRANSDERMAL | 0 refills | Status: DC

## 2022-04-30 ENCOUNTER — Encounter: Payer: Self-pay | Admitting: Family Medicine

## 2022-04-30 ENCOUNTER — Ambulatory Visit: Payer: 59 | Admitting: Family Medicine

## 2022-04-30 VITALS — BP 132/88 | Ht 67.0 in | Wt 170.0 lb

## 2022-04-30 DIAGNOSIS — M545 Low back pain, unspecified: Secondary | ICD-10-CM | POA: Diagnosis not present

## 2022-04-30 NOTE — Patient Instructions (Signed)
Your exam is reassuring. You either have some low-level inflammation still in the SI joint of your low back or a mild strain of a muscle that inserts here. Start home exercises/stretches and do daily. Aleve 1-2 tabs twice a day with food for pain and inflammation - consider taking for 7 days then as needed. Heat 15 minutes at a time as needed. Follow up with [place] in 3-4 weeks if this isn't improving as expected otherwise follow up as needed.

## 2022-04-30 NOTE — Progress Notes (Signed)
  SUBJECTIVE:   CHIEF COMPLAINT / HPI:   Right low back pain: 51 year old male presents for new onset right low back pain x1 to 1.5 weeks.  Denies trauma, bowel/bladder incontinence, radiculopathy.  Notes that he lifts weights 2-3 times per week, runs and plays tennis.  He has not had any difficulty with running.  He is unable to identify which activities make it worse but note the pain is worse later in the day.  He has tried 400 mg of ibuprofen which was beneficial.  PERTINENT  PMH / PSH: Reviewed  OBJECTIVE:  BP 132/88   Ht 5\' 7"  (1.702 m)   Wt 170 lb (77.1 kg)   BMI 26.63 kg/m  Back: No gross deformity, scoliosis. TTP over R SI joint.  No midline or bony TTP. FROM. Strength LEs 5/5 all muscle groups.   Negative SLRs. Sensation intact to light touch bilaterally.  Negative logroll, FABER, FADIR.  ASSESSMENT/PLAN:  Right low back pain: Most likely right paraspinal muscle strain.  PT would prove beneficial although home exercises and exercising as tolerated should be satisfactory.  May continue NSAID use.  Should self resolve within the next 2 to 3 weeks.  Follow-up as needed.  , DO 04/30/2022, 11:41 AM PGY-2, [facility]

## 2022-05-29 ENCOUNTER — Ambulatory Visit (INDEPENDENT_AMBULATORY_CARE_PROVIDER_SITE_OTHER): Payer: Managed Care, Other (non HMO) | Admitting: Sports Medicine

## 2022-05-29 VITALS — BP 138/88 | Ht 67.0 in | Wt 170.0 lb

## 2022-05-29 DIAGNOSIS — S76212S Strain of adductor muscle, fascia and tendon of left thigh, sequela: Secondary | ICD-10-CM | POA: Diagnosis not present

## 2022-05-29 NOTE — Assessment & Plan Note (Signed)
Because we could not see any specific level of scar tissue or other things in his adductor compartment and he is already strong I did not think we could use an injection or other directed therapy. Since his pain is very intermittent I did not really want to start gabapentin. Today we gave him a trial ESWT  ESWT left thigh adductor canal Power level 90 Head size small Frequency 12 Shocks 2000  Patient tolerated the procedure well  We will see if this lessens the frequency of his symptoms and may try repeating this if it seems helpful

## 2022-05-29 NOTE — Progress Notes (Signed)
Chief complaint intermittent sharp pain in the left thigh  Patient is a runner and tennis athlete He does not recall a specific injury but at some point felt some sharp pain in his adductor canal of his left thigh The initial evaluation for 5 months ago was negative This did sound neurogenic so we suggested taking vitamin B6 He cannot tell that that has really helped  Of interest is that he has had 1 day where the pain was quite significant The pain level would get into the 6-8 range It would last for 30 seconds and then go away but Returning most of the day The adductor area about two thirds of the way between the hip and thigh remained a little tender that day  Since then he cannot relate any worsening of pain with running, playing tennis or regular activities Sometimes he will get pain when weight lifting Sometimes he will get a sharp pain just at night with no apparent activity  Review of systems This has not been associated with weakness This has not been associated with numbness The pain does not radiate into the knee  Physical exam Physically fit male in no acute distress BP 138/88   Ht 5\' 7"  (1.702 m)   Wt 170 lb (77.1 kg)   BMI 26.63 kg/m   Knee:left Normal to inspection with no erythema or effusion or obvious bony abnormalities. Palpation normal with no warmth or joint line tenderness or patellar tenderness or condyle tenderness. ROM normal in flexion and extension and lower leg rotation. Ligaments with solid consistent endpoints including ACL, PCL, LCL, MCL. Negative Mcmurray's and provocative meniscal tests. Non painful patellar compression. Patellar and quadriceps tendons unremarkable. Hamstring and quadriceps strength is normal.  Left thigh does reveal an area of tenderness two thirds of the way between the groin and the medial knee This is only mildly tender Testing of sartorius does not make it any worse I cannot reproduce a Tinel's  Ultrasound screen I  followed the adductor canal from the femoral artery vein and nerve at the groin downward I can see the nerve but I do not see any sign of entrapment I do not see swelling or abnormality or any scar tissue in any of the muscle groups

## 2022-07-11 ENCOUNTER — Ambulatory Visit (INDEPENDENT_AMBULATORY_CARE_PROVIDER_SITE_OTHER): Payer: Self-pay | Admitting: Sports Medicine

## 2022-07-11 DIAGNOSIS — M25562 Pain in left knee: Secondary | ICD-10-CM

## 2022-07-11 NOTE — Progress Notes (Signed)
  Belmont Valli - 52 y.o. 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  He previously responded well to shockwave in the past so he would like to proceed with shockwave treatment there today. He discussed treatment plan with Dr. Oneida Alar, treatments 2x week for 6 weeks. He will return on Friday for treatment #2, I would recommend increasing power and frequency at that visit.  Procedure: ECSWT Indications:  Left Quad Calcific Tendinopathy   Procedure Details Consent: Risks of procedure as well as the alternatives and risks of each were explained to the patient.  Written consent for procedure obtained. Time Out: Verified patient identification, verified procedure, site was marked, verified correct patient position, medications/allergies/relevent history reviewed.  The area was cleaned with alcohol swab.     The Left Quad was targeted for Extracorporeal shockwave therapy.    Preset: Patellar Tendonitis (correct setting for Quad) Power Level: 90 Frequency: 10 Impulse/cycles: 2000 Head size: Large   Patient tolerated procedure well without immediate complications      Dortha Kern, MD PGY-4, Sports Medicine Fellow [facility]  Addendum:  I was the preceptor for this visit and available for immediate consultation.  Karlton Lemon MD Kirt Boys

## 2022-07-13 ENCOUNTER — Ambulatory Visit (INDEPENDENT_AMBULATORY_CARE_PROVIDER_SITE_OTHER): Payer: Self-pay | Admitting: Sports Medicine

## 2022-07-13 DIAGNOSIS — M25562 Pain in left knee: Secondary | ICD-10-CM

## 2022-07-13 MED ORDER — NITROGLYCERIN 0.2 MG/HR TD PT24: MEDICATED_PATCH | TRANSDERMAL | 0 refills | Status: AC

## 2022-07-13 NOTE — Progress Notes (Signed)
Mr. Cameron White is here today for repeat shockwave therapy to his left quad.  He reports no adverse effects or side effects after shockwave therapy earlier this week.  Follow-up next week for repeat treatment  Procedure: ECSWT Indications: Calcific tendinopathy the quad tendon   Procedure Details Consent: Risks of procedure as well as the alternatives and risks of each were explained to the patient.  Written consent for procedure obtained. Time Out: Verified patient identification, verified procedure, site was marked, verified correct patient position, medications/allergies/relevent history reviewed.  .     The left quadricep tendon was targeted for Extracorporeal shockwave therapy.    Preset: Patella tendinitis Power Level: 110 Frequency: 12 Impulse/cycles: 2000 Head size: Large   Patient tolerated procedure well without immediate complications   I was the preceptor for this visit and available for immediate consultation Marsa Aris, DO

## 2022-07-17 ENCOUNTER — Ambulatory Visit (INDEPENDENT_AMBULATORY_CARE_PROVIDER_SITE_OTHER): Payer: Self-pay | Admitting: Sports Medicine

## 2022-07-17 DIAGNOSIS — M25562 Pain in left knee: Secondary | ICD-10-CM

## 2022-07-17 NOTE — Progress Notes (Signed)
Patient ID: Cameron White, male   DOB: 09/22/1970, 52 y.o.   MRN: [id number]  Cameron White presents today for his third soundwave treatment for left quadriceps tendon calcific tendinopathy.  He has noticed some slight improvement since starting treatment.  Third treatment is performed as below and he is scheduled for his fourth treatment on February 8.  Procedure: ECSWT Indications: Calcific tendinopathy of the left quad tendon   Procedure Details Consent: Risks of procedure as well as the alternatives and risks of each were explained to the patient.  Written consent for procedure obtained. Time Out: Verified patient identification, verified procedure, site was marked, verified correct patient position, medications/allergies/relevent history reviewed.  The area was cleaned with alcohol swab.     The left quadriceps tendon was targeted for Extracorporeal shockwave therapy.    Preset: Patellar tendinitis Power Level: 110 Frequency: 12 Impulse/cycles: 2000 Head size: Large   Patient tolerated procedure well without immediate complications

## 2022-07-19 ENCOUNTER — Ambulatory Visit (INDEPENDENT_AMBULATORY_CARE_PROVIDER_SITE_OTHER): Payer: Self-pay | Admitting: Sports Medicine

## 2022-07-19 DIAGNOSIS — M25562 Pain in left knee: Secondary | ICD-10-CM

## 2022-07-19 NOTE — Progress Notes (Signed)
Patient ID: Yeriel Mineo, male   DOB: 1971/06/06, 52 y.o.   MRN: [id number]  [name] presents today for his fourth soundwave treatment for his left quadriceps tendon calcific tendinopathy.  He does note improvement since starting treatment.  Fourth treatment performed as below.  We increased the frequency today from 12 to 16 and the impulse/cycles from 2000 to 2500.  He tolerates this without difficulty.  He will follow-up next week for treatment #5.  Procedure: ECSWT Indications: Calcific tendinopathy of the left quad tendon   Procedure Details Consent: Risks of procedure as well as the alternatives and risks of each were explained to the patient.  Written consent for procedure obtained. Time Out: Verified patient identification, verified procedure, site was marked, verified correct patient position, medications/allergies/relevent history reviewed.  The area was cleaned with alcohol swab.     The left quadriceps tendon was targeted for Extracorporeal shockwave therapy.    Preset: Patellar tendinitis Power Level: 110 Frequency: 16 Impulse/cycles: 2500 Head size: Large   Patient tolerated procedure well without immediate complications    This note was dictated using Dragon naturally speaking software and may contain errors in syntax, spelling, or content which have not been identified prior to signing this note.

## 2022-07-24 ENCOUNTER — Ambulatory Visit (INDEPENDENT_AMBULATORY_CARE_PROVIDER_SITE_OTHER): Payer: Self-pay | Admitting: Sports Medicine

## 2022-07-24 DIAGNOSIS — M25562 Pain in left knee: Secondary | ICD-10-CM

## 2022-07-25 NOTE — Progress Notes (Signed)
Patient ID: Jermell Greenwell, male   DOB: June 11, 1971, 52 y.o.   MRN: [id number]  Cameron White presents today for his fifth soundwave treatment for his left quadriceps tendon calcific tendinopathy.  Treatment performed as below.  He will return to the office in 2 days for his sixth treatment.  Procedure: ECSWT Indications: Calcific tendinopathy of the left quad tendon   Procedure Details Consent: Risks of procedure as well as the alternatives and risks of each were explained to the patient.  Written consent for procedure obtained. Time Out: Verified patient identification, verified procedure, site was marked, verified correct patient position, medications/allergies/relevent history reviewed.  The area was cleaned with alcohol swab.     The left quadriceps tendon was targeted for Extracorporeal shockwave therapy.    Preset: Patellar tendinitis Power Level: 110 Frequency: 16 Impulse/cycles: 2500 Head size: Large   Patient tolerated procedure well without immediate complications    This note was dictated using Dragon naturally speaking software and may contain errors in syntax, spelling, or content which have not been identified prior to signing this note.

## 2022-07-26 ENCOUNTER — Ambulatory Visit (INDEPENDENT_AMBULATORY_CARE_PROVIDER_SITE_OTHER): Payer: Self-pay | Admitting: Sports Medicine

## 2022-07-26 DIAGNOSIS — M25562 Pain in left knee: Secondary | ICD-10-CM

## 2022-07-26 NOTE — Progress Notes (Signed)
Patient ID: Cameron White, male   DOB: 07/13/1970, 52 y.o.   MRN: [id number]  Cameron White presents today for his 6th soundwave treatment for his left quadriceps tendon calcific tendinopathy.  Treatment performed as below focusing primarily on the medial most aspect of the patellar tendon as he reports most of his pain to be in this area.  We did increase the power and number of cycles today.  He would like to return to the office next week for further treatments.  Procedure: ECSWT Indications: Calcific tendinopathy of the left quad tendon   Procedure Details Consent: Risks of procedure as well as the alternatives and risks of each were explained to the patient.  Written consent for procedure obtained. Time Out: Verified patient identification, verified procedure, site was marked, verified correct patient position, medications/allergies/relevent history reviewed.  The area was cleaned with alcohol swab.     The medialmost aspect of the left quadriceps tendon was targeted for Extracorporeal shockwave therapy.    Preset: Patellar tendinitis Power Level: 120 Frequency: 16 Impulse/cycles: 3000 Head size: Large   Patient tolerated procedure well without immediate complications   This note was dictated using Dragon naturally speaking software and may contain errors in syntax, spelling, or content which have not been identified prior to signing this note.

## 2022-07-30 ENCOUNTER — Ambulatory Visit (INDEPENDENT_AMBULATORY_CARE_PROVIDER_SITE_OTHER): Payer: 59 | Admitting: Podiatry

## 2022-07-30 DIAGNOSIS — L600 Ingrowing nail: Secondary | ICD-10-CM

## 2022-07-30 NOTE — Progress Notes (Signed)
   Chief Complaint  Patient presents with   Ingrown Toenail    Left hallux ingrown lateral border toenail which started 3 weeks ago, swelling and redness, patient denies any drainage,     Subjective: Patient presents today for evaluation of pain to the lateral border left great toe. Patient is concerned for possible ingrown nail.  It is very sensitive to touch.  Patient presents today for further treatment and evaluation.  Past Medical History:  Diagnosis Date   Achilles tendinitis on left    ASTHMA    Asthma    GERD    HALLUX RIGIDUS, ACQUIRED    History of knee surgery 04/03/2017   Hypertension    Patellar tendinitis    SACROILIAC JOINT DYSFUNCTION     Objective:  General: Well developed, nourished, in no acute distress, alert and oriented x3   Dermatology: Skin is warm, dry and supple bilateral.  Lateral border left great toe is tender with evidence of an ingrowing nail. Pain on palpation noted to the border of the nail fold. The remaining nails appear unremarkable at this time. There are no open sores, lesions.  Vascular: DP and PT pulses palpable.  No clinical evidence of vascular compromise  Neruologic: Grossly intact via light touch bilateral.  Musculoskeletal: No pedal deformity noted  Assesement: #1 Paronychia with ingrowing nail lateral border left great toe  Plan of Care:  1. Patient evaluated.  2. Discussed treatment alternatives and plan of care. Explained nail avulsion procedure and post procedure course to patient. 3. Patient opted for permanent partial nail avulsion of the ingrown portion of the nail.  4. Prior to procedure, local anesthesia infiltration utilized using 3 ml of a 50:50 mixture of 2% plain lidocaine and 0.5% plain marcaine in a normal hallux block fashion and a betadine prep performed.  5. Partial permanent nail avulsion with chemical matrixectomy performed using [id number] applications of phenol followed by alcohol flush.  6. Light dressing  applied.  Post care instructions provided 7.  Return to clinic 2 weeks  Edrick Kins, DPM [facility]  Dr. Edrick Kins, DPM    [address], [place] [address]                Office [phone]  Fax [phone]

## 2022-07-30 NOTE — Patient Instructions (Signed)

## 2022-07-31 ENCOUNTER — Ambulatory Visit (INDEPENDENT_AMBULATORY_CARE_PROVIDER_SITE_OTHER): Payer: Self-pay | Admitting: Sports Medicine

## 2022-07-31 VITALS — Ht 67.0 in

## 2022-07-31 DIAGNOSIS — M25562 Pain in left knee: Secondary | ICD-10-CM

## 2022-07-31 NOTE — Progress Notes (Signed)
Patient ID: Cameron White, male   DOB: 02/26/1971, 52 y.o.   MRN: [id number]  [name] presents today for his seventh soundwave treatment for his left quadriceps tendon calcific tendinopathy.  Treatment performed as below focusing on the medial most aspect of the patellar tendon.  He continues to note improvement.  He will follow-up as scheduled later this week for his eighth treatment.  Procedure: ECSWT Indications: Calcific tendinopathy of the left quad tendon   Procedure Details Consent: Risks of procedure as well as the alternatives and risks of each were explained to the patient.  Written consent for procedure obtained. Time Out: Verified patient identification, verified procedure, site was marked, verified correct patient position, medications/allergies/relevent history reviewed.  The area was cleaned with alcohol swab.     The medialmost aspect of the left quadriceps tendon was targeted for Extracorporeal shockwave therapy.    Preset: Patellar tendinitis Power Level: 120 Frequency: 16 Impulse/cycles: 3000 Head size: Large   Patient tolerated procedure well without immediate complications   This note was dictated using Dragon naturally speaking software and may contain errors in syntax, spelling, or content which have not been identified prior to signing this note.

## 2022-08-02 ENCOUNTER — Ambulatory Visit (INDEPENDENT_AMBULATORY_CARE_PROVIDER_SITE_OTHER): Payer: Self-pay | Admitting: Sports Medicine

## 2022-08-02 VITALS — Ht 67.0 in

## 2022-08-02 DIAGNOSIS — M76899 Other specified enthesopathies of unspecified lower limb, excluding foot: Secondary | ICD-10-CM

## 2022-08-02 NOTE — Progress Notes (Signed)
ESWT # 9  Indication: Calcific quadriceps tendinopathy  Patient ID: Cameron White, male   DOB: [birth year], 52 y.o.   MRN: [id number]   Cameron White presents today for his 9th soundwave treatment for his left quadriceps tendon calcific tendinopathy.  Treatment performed as below focusing primarily on the medial most aspect of the patellar tendon as he reports most of his pain to be in this area.  We did high volume today.  He would like to return to the office next week for further treatments.   Procedure: ECSWT Indications: Calcific tendinopathy of the left quad tendon   Procedure Details Consent: Risks of procedure as well as the alternatives and risks of each were explained to the patient.  Written consent for procedure obtained. Time Out: Verified patient identification, verified procedure, site was marked, verified correct patient position, medications/allergies/relevent history reviewed.  The area was cleaned with alcohol swab.     The medial aspect of the left quadriceps tendon was targeted for Extracorporeal shockwave therapy.    Preset: Patellar tendinitis Power Level: 120 Frequency: 16 Impulse/cycles: 3000 Head size: Large   Patient tolerated procedure well without immediate complications.  Continue Rx protocol.

## 2022-08-02 NOTE — Assessment & Plan Note (Signed)
With recurrence of calcific tendinopathy on left we are doing a series of ESWT  # 9 today  Patient sees improvement in pain and function

## 2022-08-07 ENCOUNTER — Ambulatory Visit (INDEPENDENT_AMBULATORY_CARE_PROVIDER_SITE_OTHER): Payer: Self-pay | Admitting: Sports Medicine

## 2022-08-07 VITALS — Ht 67.0 in

## 2022-08-07 DIAGNOSIS — M7652 Patellar tendinitis, left knee: Secondary | ICD-10-CM

## 2022-08-07 NOTE — Progress Notes (Signed)
Patient ID: Cameron White, male   DOB: 12-14-1970, 52 y.o.   MRN: [id number]  [name] presents today for another soundwave treatment of the left knee.  Today he would like to focus on the patellar tendon instead of the quad tendon.  Treatment performed as below.  He will follow-up as scheduled later this week for his next treatment.  Procedure: ECSWT Indications: Left knee patellar tendinopathy   Procedure Details Consent: Risks of procedure as well as the alternatives and risks of each were explained to the patient.  Written consent for procedure obtained. Time Out: Verified patient identification, verified procedure, site was marked, verified correct patient position, medications/allergies/relevent history reviewed.  The area was cleaned with alcohol swab.     The left patellar tendon was targeted for Extracorporeal shockwave therapy.    Preset: Patellar tendinitis Power Level: 110 Frequency: 14 Impulse/cycles: 3000 Head size: Large   Patient tolerated procedure well without immediate complications    This note was dictated using Dragon naturally speaking software and may contain errors in syntax, spelling, or content which have not been identified prior to signing this note.

## 2022-08-09 ENCOUNTER — Ambulatory Visit (INDEPENDENT_AMBULATORY_CARE_PROVIDER_SITE_OTHER): Payer: Self-pay | Admitting: Sports Medicine

## 2022-08-09 VITALS — Ht 67.0 in

## 2022-08-09 DIAGNOSIS — M7652 Patellar tendinitis, left knee: Secondary | ICD-10-CM

## 2022-08-09 NOTE — Progress Notes (Signed)
Patient ID: Cameron White, male   DOB: 02/17/1971, 52 y.o.   MRN: [id number]  Cameron White returns today for another soundwave treatment to the left knee.  Today, we return to treating his quadriceps tendinopathy.  We did change from the large head to the small head so that we can get a more focused treatment over the medial most aspect of the tendon.  He tolerated this without difficulty.  He will return next week for his next treatment.  Procedure: ECSWT Indications: Calcific tendinopathy of the left quad tendon   Procedure Details Consent: Risks of procedure as well as the alternatives and risks of each were explained to the patient.  Written consent for procedure obtained. Time Out: Verified patient identification, verified procedure, site was marked, verified correct patient position, medications/allergies/relevent history reviewed.  The area was cleaned with alcohol swab.     The medialmost aspect of the left quadriceps tendon was targeted for Extracorporeal shockwave therapy.    Preset: Patellar tendinitis Power Level: 120 Frequency: 16 Impulse/cycles: 3000 Head size: Small   Patient tolerated procedure well without immediate complications   This note was dictated using Dragon naturally speaking software and may contain errors in syntax, spelling, or content which have not been identified prior to signing this note.

## 2022-08-14 ENCOUNTER — Telehealth: Payer: Self-pay | Admitting: *Deleted

## 2022-08-14 ENCOUNTER — Ambulatory Visit (INDEPENDENT_AMBULATORY_CARE_PROVIDER_SITE_OTHER): Payer: Self-pay | Admitting: Sports Medicine

## 2022-08-14 DIAGNOSIS — M76899 Other specified enthesopathies of unspecified lower limb, excluding foot: Secondary | ICD-10-CM

## 2022-08-14 NOTE — Telephone Encounter (Signed)
Patient is calling because his toe is draining , red w/ a little pus noticed, has been soaking as instructed, maybe an antibiotic is needed, please advise.

## 2022-08-14 NOTE — Progress Notes (Signed)
Patient ID: Tullio Oldman, male   DOB: [birth year], 52 y.o.   MRN: [id number]  [name] returns today for another soundwave treatment to the left knee.  Treatment for calcific tendinopathy of the left quad tendon performed as below.  He tolerates this without difficulty.  He does feel like the change to the smaller head has made some difference.  He will return later this week for his final treatment.  Procedure: ECSWT Indications: Calcific tendinopathy of the left quad tendon   Procedure Details Consent: Risks of procedure as well as the alternatives and risks of each were explained to the patient.  Written consent for procedure obtained. Time Out: Verified patient identification, verified procedure, site was marked, verified correct patient position, medications/allergies/relevent history reviewed.  The area was cleaned with alcohol swab.     The left quad tendon was targeted for Extracorporeal shockwave therapy.    Power Level: 120 Frequency: 16 Impulse/cycles: 3000 Head size: Small   Patient tolerated procedure well without immediate complications   This note was dictated using Dragon naturally speaking software and may contain errors in syntax, spelling, or content which have not been identified prior to signing this note.

## 2022-08-15 ENCOUNTER — Other Ambulatory Visit: Payer: Self-pay | Admitting: Podiatry

## 2022-08-15 MED ORDER — DOXYCYCLINE HYCLATE 100 MG PO TABS: 100.0000 mg | ORAL_TABLET | Freq: Two times a day (BID) | ORAL | 0 refills | Status: AC

## 2022-08-15 NOTE — Telephone Encounter (Signed)
Prescription for doxycycline sent to the pharmacy.  Please notify patient.  Looks like he has a follow-up appointment with me on 08/20/2022.  Thanks, Dr. Amalia Hailey

## 2022-08-16 ENCOUNTER — Ambulatory Visit: Payer: Self-pay | Admitting: Sports Medicine

## 2022-08-16 DIAGNOSIS — M76899 Other specified enthesopathies of unspecified lower limb, excluding foot: Secondary | ICD-10-CM

## 2022-08-17 NOTE — Progress Notes (Signed)
Patient ID: Cameron White, male   DOB: 12/13/1970, 52 y.o.   MRN: [id number]  [name] returns today for his final soundwave treatment for left knee calcific quadriceps tendinopathy.  Treatment performed as below.  At this point in time we decided to simply see how things progress, and he understands that he may return at any time for additional treatments.  Procedure: ECSWT Indications: Calcific tendinopathy of the left quad tendon   Procedure Details Consent: Risks of procedure as well as the alternatives and risks of each were explained to the patient.  Written consent for procedure obtained. Time Out: Verified patient identification, verified procedure, site was marked, verified correct patient position, medications/allergies/relevent history reviewed.  The area was cleaned with alcohol swab.     The medialmost aspect of the left quadriceps tendon was targeted for Extracorporeal shockwave therapy.    Power Level: 120 Frequency: 16 Impulse/cycles: 3000 Head size: Small   Patient tolerated procedure well without immediate complications   This note was dictated using Dragon naturally speaking software and may contain errors in syntax, spelling, or content which have not been identified prior to signing this note.

## 2022-08-20 ENCOUNTER — Ambulatory Visit (INDEPENDENT_AMBULATORY_CARE_PROVIDER_SITE_OTHER): Payer: 59 | Admitting: Podiatry

## 2022-08-20 DIAGNOSIS — L600 Ingrowing nail: Secondary | ICD-10-CM | POA: Diagnosis not present

## 2022-08-20 NOTE — Progress Notes (Signed)
     Chief Complaint  Patient presents with   Ingrown Toenail    Left hallux ingrown toenail,  still red and sore,TX: Doxycyline     Subjective: 52 y.o. male presents today status post permanent nail avulsion procedure of the lateral border of the left great toe that was performed on 07/30/2022.  Patient states that initially he had some redness with swelling and drainage to the area however he started oral doxycycline that was prescribed on Friday and over the weekend has resolved significantly.  He feels much better.  Past Medical History:  Diagnosis Date   Achilles tendinitis on left    ASTHMA    Asthma    GERD    HALLUX RIGIDUS, ACQUIRED    History of knee surgery 04/03/2017   Hypertension    Patellar tendinitis    SACROILIAC JOINT DYSFUNCTION     Objective: Neurovascular status intact.  Skin is warm, dry and supple. Nail and respective nail fold appears to be healing appropriately.  No drainage.  No erythema  Assessment: #1 s/p partial permanent nail matrixectomy lateral border left great toe   Plan of care: #1 patient was evaluated  #2 light debridement of the periungual debris was performed to the border of the respective toe and nail plate using a tissue nipper. #3 patient is to return to clinic on a PRN basis.   Edrick Kins, DPM [facility]  Dr. Edrick Kins, DPM    [address], [place] [address]                Office [phone]  Fax [phone]

## 2022-09-20 ENCOUNTER — Ambulatory Visit (INDEPENDENT_AMBULATORY_CARE_PROVIDER_SITE_OTHER): Payer: Self-pay | Admitting: Sports Medicine

## 2022-09-20 VITALS — Ht 67.0 in

## 2022-09-20 DIAGNOSIS — S76212S Strain of adductor muscle, fascia and tendon of left thigh, sequela: Secondary | ICD-10-CM

## 2022-09-20 NOTE — Assessment & Plan Note (Signed)
Excellent result with ESWT in December  With recurrence of symptoms we are trying ESWT #2 today

## 2022-09-20 NOTE — Progress Notes (Signed)
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  He has been running over the past 2 weeks and now is having nerve related pain in the left adductor canal again  ESWT Left adductor canal mid to upper third Power level 90 mJ Impulses 2000 Small head size Frequency 10  Patient tolerated the procedure well and we will repeat if needed.

## 2022-11-13 ENCOUNTER — Ambulatory Visit: Payer: 59 | Admitting: Sports Medicine

## 2022-11-13 ENCOUNTER — Other Ambulatory Visit: Payer: Self-pay

## 2022-11-13 VITALS — BP 136/86 | Ht 68.0 in | Wt 170.0 lb

## 2022-11-13 DIAGNOSIS — M25521 Pain in right elbow: Secondary | ICD-10-CM | POA: Diagnosis not present

## 2022-11-13 NOTE — Progress Notes (Signed)
Established Patient Office Visit  Subjective   Patient ID: Cameron White, male    DOB: 1970/11/14  Age: 52 y.o. MRN: [id number]  Right elbow pain.  He is here today with chief complaint of right elbow pain for the past 3 to 4 weeks.  He denies any injury to his elbow just noticed it slowly.  He does play a lot of tennis and it bothers him with overhand serve and sometimes his for hand hit.  He is unable to throw the football very much with his son over memorial day secondary to the pain.  Most of his pain is located in his medial elbow.  He has tried some stretches with only temporary relief.  He has cut back on tennis recently as it has been painful.  He has a history of tennis elbow in the past.  He denies any numbness, tingling, radiation of his pain down his arm or weakness in his hand.  He is right-hand dominant   ROS as listed above in HPI    Objective:     BP 136/86   Ht 5\' 8"  (1.727 m)   Wt 170 lb (77.1 kg)   BMI 25.85 kg/m   Physical Exam Vitals reviewed.  Constitutional:      General: He is not in acute distress.    Appearance: Normal appearance. He is not ill-appearing, toxic-appearing or diaphoretic.  Pulmonary:     Effort: Pulmonary effort is normal.  Skin:    General: Skin is warm.  Neurological:     Mental Status: He is alert.   Right elbow: No obvious deformity or asymmetry.  No ecchymosis or edema.  Tenderness to palpation of the medial epicondyle.  Full range of motion at the elbow and wrist with flexion and extension.  Slight pain with resisted wrist flexion.  Some discomfort of the lateral epicondyle with resisted wrist extension.  Grip strength 5/5 bilaterally.  Radial pulse 2+.  Negative Tinel's test at the elbow.  Limited ultrasound: Right elbow Medial condyle was visualized.  Flexor tendon bundle was seen.  No hypoechoic changes or tendon disruption was seen. Lateral epicondyle was visualized.  Extensor tendon boss dull was seen.  No hypoechoic changes  or tendon disruption.  There were 2 small hyperechoic calcifications seen.  Age-indeterminate Impression, no obvious bony fracture or avulsion.    Assessment & Plan:   Problem List Items Addressed This Visit       Other   Right elbow pain - Primary    Medial epicondylitis.  Patient was given stretches and exercises to perform for the next 3 to 4 weeks 1-2 times a day.  Recommend he ice the area vigorously.  He can also try counter force brace to use during return to tennis.  Discussed with him slow gradual return as his pain has resolved.  He verbalized understanding.  He can follow-up in our clinic in 3 to 4 weeks if his symptoms worsen or fail to improve.  At that time we can can discuss further interventions such as shockwave therapy versus corticosteroid injection.      Relevant Orders   [place] LIMITED JOINT SPACE STRUCTURES UP RIGHT    Return in about 4 weeks (around 12/11/2022), or if symptoms worsen or fail to improve.    Claudie Leach, DO  Patient seen and evaluated with the sports medicine fellow.  I agree with the above plan of care.  Treatment as above for medial epicondylitis.  If symptoms persist, consider merits  of soundwave treatment.  Follow-up for ongoing or recalcitrant issues.

## 2022-11-13 NOTE — Assessment & Plan Note (Signed)
Medial epicondylitis.  Patient was given stretches and exercises to perform for the next 3 to 4 weeks 1-2 times a day.  Recommend he ice the area vigorously.  He can also try counter force brace to use during return to tennis.  Discussed with him slow gradual return as his pain has resolved.  He verbalized understanding.  He can follow-up in our clinic in 3 to 4 weeks if his symptoms worsen or fail to improve.  At that time we can can discuss further interventions such as shockwave therapy versus corticosteroid injection.

## 2022-12-04 ENCOUNTER — Other Ambulatory Visit: Payer: Self-pay | Admitting: Internal Medicine

## 2022-12-04 ENCOUNTER — Ambulatory Visit
Admission: RE | Admit: 2022-12-04 | Discharge: 2022-12-04 | Disposition: A | Discharge status: Home / Self Care | Payer: Managed Care, Other (non HMO) | Source: Ambulatory Visit | Attending: Internal Medicine | Admitting: Internal Medicine

## 2022-12-04 DIAGNOSIS — R1033 Periumbilical pain: Secondary | ICD-10-CM

## 2022-12-04 MED ORDER — IOPAMIDOL (ISOVUE-300) INJECTION 61%
100.0000 mL | Freq: Once | INTRAVENOUS | Status: AC | PRN
  Administered 2022-12-04: 100 mL via INTRAVENOUS

## 2023-03-12 ENCOUNTER — Ambulatory Visit: Payer: Managed Care, Other (non HMO) | Admitting: Family Medicine

## 2023-03-12 ENCOUNTER — Encounter: Payer: Self-pay | Admitting: Family Medicine

## 2023-03-12 VITALS — BP 126/72 | Ht 67.0 in | Wt 155.0 lb

## 2023-03-12 DIAGNOSIS — M79641 Pain in right hand: Secondary | ICD-10-CM | POA: Insufficient documentation

## 2023-03-12 DIAGNOSIS — M79644 Pain in right finger(s): Secondary | ICD-10-CM | POA: Diagnosis not present

## 2023-03-12 NOTE — Progress Notes (Signed)
DATE OF VISIT: 03/12/2023        Cameron White DOB: 18-May-1971 MRN: [id number]  CC:  Rt thumb pain  History- Cameron White is a 52 y.o. RT-hand dominant male for evaluation and treatment of Rt thumb/wrist pain.  He is a Armed forces operational officer.  Denies any specific injury or trauma.  Pain over the last month.  Pain at the [facility] and MCP joints of the thumb.  A little bit worse when playing tennis recently.  Also tried lifting some weights which was uncomfortable.  Some pain down into the thenar eminence.  Denies any nighttime pain.  Has not been taking any medications for this.  Has not been using any heat or ice.  Denies any numbness or tingling.  Has not had any previous imaging.  No prior issues with this thumb.  He does have history of right tennis elbow which has improved. Notes is worse when using his smart phone.  He works in Research officer, political party.   Past Medical History Past Medical History:  Diagnosis Date   Achilles tendinitis on left    ASTHMA    Asthma    GERD    HALLUX RIGIDUS, ACQUIRED    History of knee surgery 04/03/2017   Hypertension    Patellar tendinitis    SACROILIAC JOINT DYSFUNCTION     Past Surgical History Past Surgical History:  Procedure Laterality Date   27 HOUR PH STUDY N/A 03/23/2020   Procedure: 24 HOUR PH STUDY;  Surgeon: Napoleon Form, MD;  Location: [facility];  Service: Endoscopy;  Laterality: N/A;  24 hour Ph Impedance study off PPI and H2 blocker   bone spurs Bilateral 03/2017   bone spurs removed   ESOPHAGEAL MANOMETRY N/A 03/23/2020   Procedure: ESOPHAGEAL MANOMETRY (EM);  Surgeon: Napoleon Form, MD;  Location: [facility];  Service: Endoscopy;  Laterality: N/A;   KNEE SURGERY     NO PAST SURGERIES      Medications Current Outpatient Medications  Medication Sig Dispense Refill   albuterol (VENTOLIN HFA) 108 (90 Base) MCG/ACT inhaler INHALE 2 PUFFS INTO THE LUNGS EVERY 6 HOURS AS NEEDED FOR WHEEZING OR SHORTNESS OF BREATH (Patient not taking:  Reported on 12/25/2019) 6.7 g 0   doxycycline (VIBRA-TABS) 100 MG tablet Take 1 tablet (100 mg total) by mouth 2 (two) times daily. 20 tablet 0   esomeprazole (NEXIUM) 40 MG capsule Take 1 capsule (40 mg total) by mouth daily at 12 noon. 90 capsule 3   famotidine (PEPCID) 20 MG tablet Take 1 tablet (20 mg total) by mouth at bedtime. (Patient not taking: Reported on 03/08/2020) 30 tablet 3   fluticasone (FLONASE) 50 MCG/ACT nasal spray Place 2 sprays into both nostrils daily. (Patient taking differently: Place 2 sprays into both nostrils as needed. ) 16 g 6   ketoconazole (NIZORAL) 2 % cream APP AA BID  2   losartan (COZAAR) 50 MG tablet TAKE 1 TABLET(50 MG) BY MOUTH DAILY 90 tablet 0   nitroGLYCERIN (NITRODUR - DOSED IN MG/24 HR) 0.2 mg/hr patch Use 1/4 patch daily to the affected area. 30 patch 0   SYMBICORT 160-4.5 MCG/ACT inhaler INHALE 2 PUFFS INTO THE LUNGS TWICE DAILY (Patient not taking: Reported on 12/25/2019) 10.2 g 0   No current facility-administered medications for this visit.    Allergies has No Known Allergies.  Family History - reviewed per EMR and intake form  Social History   reports current alcohol use of about 4.0 standard drinks of alcohol per  week.  reports that he has never smoked. He has never used smokeless tobacco.  reports no history of drug use. OCCUPATION: Real estate    EXAM: Vitals: BP 126/72   Ht 5\' 7"  (1.702 m)   Wt 155 lb (70.3 kg)   BMI 24.28 kg/m  General: AOx3, NAD, pleasant SKIN: no rashes or lesions, skin clean, dry, intact MSK: RT HAND/WRIST: Mild soft tissue swelling along the right first CMC and MCP joint.  No increased redness or warmth.  No significant tenderness to palpation.  Full range of motion without any palpable crepitus.  Normal intrinsic and extrinsic hand strength.  Negative Finklestein's test.  Normal grip strength.  No bony tenderness throughout the hand or the wrist. Left hand/wrist with full range of motion without pain,  weakness.  NEURO: sensation intact to light touch, upper ext bilaterally VASC: pulses 2+ and symmetric radial artery bilaterally, no edema  Assessment & Plan Right hand pain Right hand/thumb pain along the CMC and MCP joint x 1 month, suspect underlying OA  Plan: 1.  Imaging: X-ray of the right hand ordered to rule out OA at the [facility] and MCP joint of the thumb and rule out any other abnormalities 2.  Fitted with thumb loop in office today.  Can wear while sleeping, but can also use throughout the day for some added support along the thumb. 3.  Recommend Voltaren gel OTC applied every 6-8 hours as needed 4.  Can apply heat or ice as needed 5.  Recommend rest from dedicated exercise over the next 1 to 2 weeks, can advance as tolerated.  Can resume home exercise program for the thumb when pain is improving and gradually return to tennis and weightlifting activities 6.  I will reach out with x-ray results once available and discuss any further treatment 7.  Should follow-up in 3 to 4 weeks if no improvement, sooner as needed.  Can reach out with any questions or concerns.  Patient expressed understanding agreement with above Thumb pain, right Right hand/thumb pain along the CMC and MCP joint x 1 month, suspect underlying OA - see A/P for Rt hand pain above     Encounter Diagnoses  Name Primary?   Right hand pain Yes   Thumb pain, right     Orders Placed This Encounter  Procedures   DG Hand Complete Right    Orders Placed This Encounter  Procedures   DG Hand Complete Right

## 2023-03-12 NOTE — Assessment & Plan Note (Addendum)
Right hand/thumb pain along the CMC and MCP joint x 1 month, suspect underlying OA - see A/P for Rt hand pain above

## 2023-03-12 NOTE — Patient Instructions (Addendum)
You have pain in your right thumb/hand which is likely due to mild arthritis of you some of the small joints in your hand/wrist called the CMC and MCP - I placed an order for you to get an xray to see how much arthritis may be present in that area - we fit you with a brace to help unload the thumb.  You can wear this at bedtime.  You can also wear during the day as needed - you can apply over-the-counter Voltaren gel to thumb/wrist area every 6-8 hours as needed.   - since you are having pain with the exercises, you should rest from those for 1-2 weeks and can try resuming when your pain has improved. - if improving over the next few weeks you can gradually try to return to tennis. - you should follow-up with me in 3-4 weeks if no improvement or sooner as needed.  Don't hesitate to reach out with any questions.

## 2023-03-12 NOTE — Assessment & Plan Note (Addendum)
Right hand/thumb pain along the CMC and MCP joint x 1 month, suspect underlying OA  Plan: 1.  Imaging: X-ray of the right hand ordered to rule out OA at the [facility] and MCP joint of the thumb and rule out any other abnormalities 2.  Fitted with thumb loop in office today.  Can wear while sleeping, but can also use throughout the day for some added support along the thumb. 3.  Recommend Voltaren gel OTC applied every 6-8 hours as needed 4.  Can apply heat or ice as needed 5.  Recommend rest from dedicated exercise over the next 1 to 2 weeks, can advance as tolerated.  Can resume home exercise program for the thumb when pain is improving and gradually return to tennis and weightlifting activities 6.  I will reach out with x-ray results once available and discuss any further treatment 7.  Should follow-up in 3 to 4 weeks if no improvement, sooner as needed.  Can reach out with any questions or concerns.  Patient expressed understanding agreement with above

## 2023-03-13 ENCOUNTER — Ambulatory Visit
Admission: RE | Admit: 2023-03-13 | Discharge: 2023-03-13 | Disposition: A | Discharge status: Home / Self Care | Payer: 59 | Source: Ambulatory Visit | Attending: Family Medicine | Admitting: Family Medicine

## 2023-03-13 DIAGNOSIS — M79641 Pain in right hand: Secondary | ICD-10-CM

## 2023-03-18 ENCOUNTER — Encounter: Payer: Self-pay | Admitting: Family Medicine

## 2023-04-15 ENCOUNTER — Other Ambulatory Visit: Payer: Self-pay

## 2023-04-15 ENCOUNTER — Ambulatory Visit: Payer: 59 | Admitting: Family Medicine

## 2023-04-15 VITALS — BP 110/78 | Ht 68.0 in | Wt 155.0 lb

## 2023-04-15 DIAGNOSIS — M25521 Pain in right elbow: Secondary | ICD-10-CM

## 2023-04-15 MED ORDER — MELOXICAM 15 MG PO TABS: 15.0000 mg | ORAL_TABLET | Freq: Every day | ORAL | 2 refills | Status: AC

## 2023-04-15 NOTE — Patient Instructions (Addendum)
You have an elbow synovitis. Ice the elbow 15 minutes at a time 3-4 times a day. Meloxicam 15 mg daily with food for pain and inflammation - take for 7-10 days regularly then as needed. Compression sleeve during the day to help get the swelling down. Start home exercises in 1 week; can consider formal physical therapy too. I expect you'll be able to get back to playing in 2 weeks.

## 2023-04-15 NOTE — Progress Notes (Unsigned)
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  Last seen on 11/13/2022 and diagnosed with medial epicondylitis.  Was given stretches to do at home.  Also advised to use ice and bracing.  Today, pain is about the same the medial side.  However, after spreading mulch about 5 weeks ago, he started to have more pain behind the elbow and on the lateral side.  It hurts worse whenever he bends it fully and whenever he extends it fully.  It has been difficult for him to play tennis because of this.  He denies any fever, swelling, redness, warmth of the joint.  Past Medical History:  Diagnosis Date   Achilles tendinitis on left    ASTHMA    Asthma    GERD    HALLUX RIGIDUS, ACQUIRED    History of knee surgery 04/03/2017   Hypertension    Patellar tendinitis    SACROILIAC JOINT DYSFUNCTION     Current Outpatient Medications on File Prior to Visit  Medication Sig Dispense Refill   albuterol (VENTOLIN HFA) 108 (90 Base) MCG/ACT inhaler INHALE 2 PUFFS INTO THE LUNGS EVERY 6 HOURS AS NEEDED FOR WHEEZING OR SHORTNESS OF BREATH (Patient not taking: Reported on 12/25/2019) 6.7 g 0   doxycycline (VIBRA-TABS) 100 MG tablet Take 1 tablet (100 mg total) by mouth 2 (two) times daily. 20 tablet 0   esomeprazole (NEXIUM) 40 MG capsule Take 1 capsule (40 mg total) by mouth daily at 12 noon. 90 capsule 3   famotidine (PEPCID) 20 MG tablet Take 1 tablet (20 mg total) by mouth at bedtime. (Patient not taking: Reported on 03/08/2020) 30 tablet 3   fluticasone (FLONASE) 50 MCG/ACT nasal spray Place 2 sprays into both nostrils daily. (Patient taking differently: Place 2 sprays into both nostrils as needed. ) 16 g 6   ketoconazole (NIZORAL) 2 % cream APP AA BID  2   losartan (COZAAR) 50 MG tablet TAKE 1 TABLET(50 MG) BY MOUTH DAILY 90 tablet 0   nitroGLYCERIN (NITRODUR - DOSED IN MG/24 HR) 0.2 mg/hr patch Use 1/4 patch daily to the affected area.  30 patch 0   SYMBICORT 160-4.5 MCG/ACT inhaler INHALE 2 PUFFS INTO THE LUNGS TWICE DAILY (Patient not taking: Reported on 12/25/2019) 10.2 g 0   No current facility-administered medications on file prior to visit.    Past Surgical History:  Procedure Laterality Date   63 HOUR PH STUDY N/A 03/23/2020   Procedure: 24 HOUR PH STUDY;  Surgeon: Napoleon Form, MD;  Location: [facility];  Service: Endoscopy;  Laterality: N/A;  24 hour Ph Impedance study off PPI and H2 blocker   bone spurs Bilateral 03/2017   bone spurs removed   ESOPHAGEAL MANOMETRY N/A 03/23/2020   Procedure: ESOPHAGEAL MANOMETRY (EM);  Surgeon: Napoleon Form, MD;  Location: [facility];  Service: Endoscopy;  Laterality: N/A;   KNEE SURGERY     NO PAST SURGERIES      No Known Allergies  BP 110/78   Ht 5\' 8"  (1.727 m)   Wt 155 lb (70.3 kg)   BMI 23.57 kg/m      02/04/2020    1:30 PM 03/17/2020    3:34 PM  [facility]  Frequency of aerobic exercise (# of days/week) 5 5  Average time in minutes 30 30  Frequency of strengthening activities (# of days/week) 2 2  No data to display              Objective:  Physical Exam:  Gen: NAD, comfortable in exam room  MSK: Right elbow without swelling, erythema, warmth as compared to left, tenderness to palpation elicited over medial side as well as over triceps tendon insertion, full range of motion though pain elicited with with full extension and full flexion, strength and sensation intact, valgus stress testing with pain on the medial side, negative Tinel's  Right elbow ultrasound findings: Small hypoechoic structure below fat pad and adjacent to olecranon process   Assessment & Plan:  1.  Right elbow synovitis: Most likely diagnosis given exam and evidence of fluid collection on ultrasound.  Likely in the setting of overuse.  Also with likely concurrent mild tennis elbow.  Fluid collection likely too small to aspirate.   Discussed icing and preventing overuse of joint.  Will send in meloxicam 15 mg to be taken for 7 to 10 days to reduce inflammation.  Will also provide home exercises to target the triceps tendon.  Can consider physical therapy if pain continues or corticosteroid injection as last resort.  Follow-up in 1 month.  Janeal Holmes, MD PGY-2, [facility]

## 2023-04-16 ENCOUNTER — Encounter: Payer: Self-pay | Admitting: Family Medicine

## 2023-10-10 ENCOUNTER — Ambulatory Visit: Admitting: Sports Medicine

## 2023-10-10 ENCOUNTER — Other Ambulatory Visit: Payer: Self-pay

## 2023-10-10 VITALS — BP 128/86 | Ht 68.0 in | Wt 165.0 lb

## 2023-10-10 DIAGNOSIS — M25561 Pain in right knee: Secondary | ICD-10-CM

## 2023-10-10 NOTE — Progress Notes (Signed)
   PCP: Margarete Sharps, MD  SUBJECTIVE:   HPI:  Patient is a 53 y.o. male here with chief complaint of acute right knee pain.  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  He presents with 1-2 weeks of right knee soreness/tightness. Yesterday while running he felt a twinge on the lateral aspect of the knee. Doesn't think he tore anything, but wanted to have it checked. Currently locates pain deep to the patella laterally. No swelling. Today is better than yesterday.  Does endorse some locking in the knee yesteday but not today.  Pertinent ROS were reviewed with the patient and found to be negative unless otherwise specified above in HPI.   PERTINENT  PMH / PSH / FH / SH:  Past Medical, Surgical, Social, and Family History Reviewed & Updated in the EMR.  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, h/o of bilateral spur debridement  Past Surgical History:  Procedure Laterality Date   33 HOUR PH STUDY N/A 03/23/2020   Procedure: 24 HOUR PH STUDY;  Surgeon: Sergio Dandy, MD;  Location: [facility];  Service: Endoscopy;  Laterality: N/A;  24 hour Ph Impedance study off PPI and H2 blocker   bone spurs Bilateral 03/2017   bone spurs removed   ESOPHAGEAL MANOMETRY N/A 03/23/2020   Procedure: ESOPHAGEAL MANOMETRY (EM);  Surgeon: Sergio Dandy, MD;  Location: [facility];  Service: Endoscopy;  Laterality: N/A;   KNEE SURGERY     NO PAST SURGERIES     No Known Allergies  OBJECTIVE:  BP 128/86   Ht 5\' 8"  (1.727 m)   Wt 165 lb (74.8 kg)   BMI 25.09 kg/m   PHYSICAL EXAM:  GEN: Alert and Oriented, NAD, comfortable in exam room RESP: Unlabored respirations, symmetric chest rise PSY: normal mood, congruent affect   RIGHT KNEE MSK EXAM: No gross deformity, ecchymoses, swelling. TTP mildly along posteromedial joint  line. No tenderness along lateral joint line, posterior knee, distal quad or patella. FROM with normal strength. Hip abductors 5/5. Negative ant/post drawers. Negative valgus/varus testing. Negative lachman.  Negative mcmurrays, apleys.  + Patellar grind NV intact distally.  Assessment & Plan Acute pain of right knee Reassuring exam today. I do not think he did any significant internal damage. We reviewed his x-rays from 2018 and at that time he didn't have any significant arthritis in the knee but given his + clark test and mild medial joint line pain I suspect some early degenerative changes. Encouraged knee compression sleeve use, VMO strengthening, and prn NSAIDs. F/u as needed.  Lin Rend, MD PGY-4, Sports Medicine Fellow [facility]

## 2023-11-14 ENCOUNTER — Ambulatory Visit: Admitting: Sports Medicine

## 2023-11-14 VITALS — BP 135/93 | Ht 68.0 in | Wt 170.0 lb

## 2023-11-14 DIAGNOSIS — M76899 Other specified enthesopathies of unspecified lower limb, excluding foot: Secondary | ICD-10-CM | POA: Diagnosis not present

## 2023-11-14 MED ORDER — COLCHICINE 0.6 MG PO TABS: 0.6000 mg | ORAL_TABLET | Freq: Two times a day (BID) | ORAL | 4 refills | Status: AC

## 2023-11-14 NOTE — Assessment & Plan Note (Signed)
 I think he has recurrent flares related to the calcific deposits in his quad tendons as well as some in patellar tendons.  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  We will see if he responds to colchicine - 0.6 bid. Note:  NSAIDS - minimal help NTG - questionably some benefit ESWT - Treatment trial did not show benefit with single treatment Surgical excision - benefit but sxs recurred after 1 year  Reck 1 mo

## 2023-11-14 NOTE — Patient Instructions (Signed)
 Colchicine Capsules What is this medication? COLCHICINE (KOL chi seen) prevents gout attacks. It works by decreasing inflammation and reducing the buildup of uric acid in your joints. This medicine may be used for other purposes; ask your health care provider or pharmacist if you have questions. COMMON BRAND NAME(S): MITIGARE What should I tell my care team before I take this medication? They need to know if you have any of these conditions: Kidney disease Liver disease An unusual or allergic reaction to colchicine, other medications, foods, dyes, or preservatives Pregnant or trying to get pregnant Breast-feeding How should I use this medication? Take this medication by mouth with water. Take it as directed on the prescription label at the same time every day. You can take it with or without food. If it upsets your stomach, take it with food. A special MedGuide will be given to you by the pharmacist with each prescription and refill. Be sure to read this information carefully each time. Talk to your care team about the use of this medication in children. Special care may be needed. People 65 years and older may have a stronger reaction and need a smaller dose. Overdosage: If you think you have taken too much of this medicine contact a poison control center or emergency room at once. NOTE: This medicine is only for you. Do not share this medicine with others. What if I miss a dose? If you miss a dose, take it as soon as you can. If it is almost time for your next dose, take only that dose. Do not take double or extra doses. What may interact with this medication? Do not take this medication with any of the following: Certain antivirals for HIV or hepatitis This medication may also interact with the following: Certain antibiotics, such as erythromycin  or clarithromycin Certain medications for blood pressure, heart disease, irregular heartbeat Certain medications for cholesterol, such as  atorvastatin, lovastatin, or simvastatin Certain medications for fungal infections, such as ketoconazole, itraconazole, or posaconazole Cyclosporine Grapefruit or grapefruit juice This list may not describe all possible interactions. Give your health care provider a list of all the medicines, herbs, non-prescription drugs, or dietary supplements you use. Also tell them if you smoke, drink alcohol, or use illegal drugs. Some items may interact with your medicine. What should I watch for while using this medication? Visit your care team for regular checks on your progress. Tell your care team if your symptoms do not start to get better or if they get worse. You should make sure you get enough vitamin B12 while you are taking this medication. Discuss the foods you eat and the vitamins you take with your care team. This medication may increase your risk to bruise or bleed. Call you care team if you notice any unusual bleeding. What side effects may I notice from receiving this medication? Side effects that you should report to your care team as soon as possible: Allergic reactions--skin rash, itching, hives, swelling of the face, lips, tongue, or throat Infection--fever, chills, cough, sore throat, wounds that don't heal, pain or trouble when passing urine, general feeling of discomfort or being unwell Muscle injury--unusual weakness or fatigue, muscle pain, dark yellow or brown urine, decrease in the amount of urine Pain, tingling, or numbness in the hands or feet Unusual bleeding or bruising Side effects that usually do not require medical attention (report these to your care team if they continue or are bothersome): Diarrhea Nausea Vomiting This list may not describe all possible  side effects. Call your doctor for medical advice about side effects. You may report side effects to FDA at 1-800-FDA-1088. Where should I keep my medication? Keep out of the reach of children and pets. Store at room  temperature between 20 and 25 degrees C (68 and 77 degrees F). Protect from light and moisture. Keep the container tightly closed. Get rid of any unused medication after the expiration date. To get rid of medications that are no longer needed or have expired: Take the medication to a medication take-back program. Check with your pharmacy or law enforcement to find a location. If you cannot return the medication, check the label or package insert to see if the medication should be thrown out in the garbage or flushed down the toilet. If you are not sure, ask your care team. If it is safe to put it in the trash, take the medication out of the container. Mix the medication with cat litter, dirt, coffee grounds, or other unwanted substance. Seal the mixture in a bag or container. Put it in the trash. NOTE: This sheet is a summary. It may not cover all possible information. If you have questions about this medicine, talk to your doctor, pharmacist, or health care provider.  2024 [employer]vier/Gold Standard (2021-12-27 00:00:00)

## 2023-11-14 NOTE — Progress Notes (Signed)
 PCP: Margarete Sharps, MD  Subjective:   HPI: Patient is a 53 y.o. male here for bilateral knee pain.  Cameron White was last seen 10/10/2023 for bilateral knee pain, where he was recommended to use a compressive sleeve and do quadriceps strengthening exercises. Since then, he has continued to have bilateral knee pain that is made worse with weight bearing. He states the pain is a sharp ache that is made worse with pushing off his feet. The pain is mainly located in anterior knee into the quadriceps tendon. He has been doing exercises to strengthen his quads, hamstrings, adductors and abductors without much improvement. Ibuprofen and ice have not been helpful. He has not had any trauma to the knee but has had surgery in the past to remove bone spurs from both quadriceps tendons and the patellar tendon of the left knee.   Past Medical History:  Diagnosis Date   Achilles tendinitis on left    ASTHMA    Asthma    GERD    HALLUX RIGIDUS, ACQUIRED    History of knee surgery 04/03/2017   Hypertension    Patellar tendinitis    SACROILIAC JOINT DYSFUNCTION     Current Outpatient Medications on File Prior to Visit  Medication Sig Dispense Refill   albuterol  (VENTOLIN  HFA) 108 (90 Base) MCG/ACT inhaler INHALE 2 PUFFS INTO THE LUNGS EVERY 6 HOURS AS NEEDED FOR WHEEZING OR SHORTNESS OF BREATH (Patient not taking: Reported on 12/25/2019) 6.7 g 0   doxycycline  (VIBRA -TABS) 100 MG tablet Take 1 tablet (100 mg total) by mouth 2 (two) times daily. 20 tablet 0   esomeprazole  (NEXIUM ) 40 MG capsule Take 1 capsule (40 mg total) by mouth daily at 12 noon. 90 capsule 3   famotidine  (PEPCID ) 20 MG tablet Take 1 tablet (20 mg total) by mouth at bedtime. (Patient not taking: Reported on 03/08/2020) 30 tablet 3   fluticasone  (FLONASE ) 50 MCG/ACT nasal spray Place 2 sprays into both nostrils daily. (Patient taking differently: Place 2 sprays into both nostrils as needed. ) 16 g 6   ketoconazole (NIZORAL) 2 % cream APP AA BID  2    losartan  (COZAAR ) 50 MG tablet TAKE 1 TABLET(50 MG) BY MOUTH DAILY 90 tablet 0   meloxicam  (MOBIC ) 15 MG tablet Take 1 tablet (15 mg total) by mouth daily. 30 tablet 2   nitroGLYCERIN  (NITRODUR - DOSED IN MG/24 HR) 0.2 mg/hr patch Use 1/4 patch daily to the affected area. 30 patch 0   SYMBICORT  160-4.5 MCG/ACT inhaler INHALE 2 PUFFS INTO THE LUNGS TWICE DAILY (Patient not taking: Reported on 12/25/2019) 10.2 g 0   No current facility-administered medications on file prior to visit.    Past Surgical History:  Procedure Laterality Date   58 HOUR PH STUDY N/A 03/23/2020   Procedure: 24 HOUR PH STUDY;  Surgeon: Sergio Dandy, MD;  Location: [facility];  Service: Endoscopy;  Laterality: N/A;  24 hour Ph Impedance study off PPI and H2 blocker   bone spurs Bilateral 03/2017   bone spurs removed   ESOPHAGEAL MANOMETRY N/A 03/23/2020   Procedure: ESOPHAGEAL MANOMETRY (EM);  Surgeon: Sergio Dandy, MD;  Location: [facility];  Service: Endoscopy;  Laterality: N/A;   KNEE SURGERY     NO PAST SURGERIES      No Known Allergies  BP (!) 135/93   Ht 5\' 8"  (1.727 m)   Wt 170 lb (77.1 kg)   BMI 25.85 kg/m      02/04/2020  1:30 PM 03/17/2020    3:34 PM  [facility]  Frequency of aerobic exercise (# of days/week) 5 5  Average time in minutes 30 30  Frequency of strengthening activities (# of days/week) 2 2        No data to display              Objective:  Physical Exam:  Gen: NAD, comfortable in exam room  BP (!) 135/93   Ht 5\' 8"  (1.727 m)   Wt 170 lb (77.1 kg)   BMI 25.85 kg/m    MSK:  Knee Inspection: No bruises, deformities, erythema Palpation: No tenderness to patella, quadriceps tendon, patellar tendon, medial and lateral joint lines ROM: full flexion, extension Strength: 5/5 flexion, extension  Special Tests: Varus negative, valgus negative, Lachman negative, McMurray negative  Hip Inspection: No bruises, deformities,  erythema Palpation: No tenderness to greater trochanter, IT band ROM: full flexion, extension, IR/ER Strength: 5/5 flexion, abduction  Special Tests: straight leg raise negative   Assessment & Plan:  Patient is a 53 y.o. male here for chronic bilateral knee pain. No signs of knee joint instability, bony tenderness, and history of calcific tendonitis makes calcium pyrophosphate disease seem like a possible li cause of his bilateral knee pain.   1. 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

## 2023-12-17 ENCOUNTER — Ambulatory Visit: Admitting: Sports Medicine

## 2023-12-17 VITALS — BP 120/78 | Ht 68.0 in | Wt 170.0 lb

## 2023-12-17 DIAGNOSIS — M76899 Other specified enthesopathies of unspecified lower limb, excluding foot: Secondary | ICD-10-CM

## 2023-12-17 NOTE — Progress Notes (Signed)
 Chief complaint follow-up bilateral calcific quadriceps and patellar tendinopathy  Patient was having persistent sharp pain that was limiting his running and sports We gave him a trial of colchicine  0.6 mg twice daily Since that time he has had a dramatic reduction to where his pain which was daily is now a usually once a week or less He has not seen any swelling or instability in the knees He is not noting any side effects from the colchicine   Physical exam Pleasant white male in no acute distress BP 120/78   Ht 5' 8 (1.727 m)   Wt 170 lb (77.1 kg)   BMI 25.85 kg/m   Knee: Right and left Normal to inspection with no erythema or effusion or obvious bony abnormalities. Palpation normal with no warmth or joint line tenderness or patellar tenderness or condyle tenderness. ROM normal in flexion and extension and lower leg rotation. Ligaments with solid consistent endpoints including ACL, PCL, LCL, MCL. Negative Mcmurray's and provocative meniscal tests. Non painful patellar compression. Patellar and quadriceps tendons unremarkable. Hamstring and quadriceps strength is normal.

## 2023-12-17 NOTE — Assessment & Plan Note (Signed)
 We will continue using colchicine  as this seems to been beneficial symptomatically  After a 70-month trial I would like to repeat his ultrasound to see if there are any lessening of his calcifications in both his patellar and quadriceps tendons  Continue with home exercise program
